# Patient Record
Sex: Female | Born: 1960 | Race: White | Hispanic: No | Marital: Married | Smoking: Never smoker
Health system: Southern US, Community
[De-identification: ages and names within clinical notes are randomized; demographics above are authoritative.]

## PROBLEM LIST (undated history)

## (undated) DIAGNOSIS — E669 Obesity, unspecified: Secondary | ICD-10-CM

## (undated) DIAGNOSIS — E7800 Pure hypercholesterolemia, unspecified: Secondary | ICD-10-CM

## (undated) DIAGNOSIS — N200 Calculus of kidney: Secondary | ICD-10-CM

## (undated) DIAGNOSIS — K5900 Constipation, unspecified: Secondary | ICD-10-CM

## (undated) DIAGNOSIS — G4730 Sleep apnea, unspecified: Secondary | ICD-10-CM

## (undated) DIAGNOSIS — M7989 Other specified soft tissue disorders: Secondary | ICD-10-CM

## (undated) DIAGNOSIS — J45991 Cough variant asthma: Secondary | ICD-10-CM

## (undated) DIAGNOSIS — E559 Vitamin D deficiency, unspecified: Secondary | ICD-10-CM

## (undated) HISTORY — DX: Calculus of kidney: N20.0

## (undated) HISTORY — DX: Vitamin D deficiency, unspecified: E55.9

## (undated) HISTORY — DX: Obesity, unspecified: E66.9

## (undated) HISTORY — PX: OTHER SURGICAL HISTORY: SHX169

## (undated) HISTORY — DX: Other specified soft tissue disorders: M79.89

## (undated) HISTORY — PX: NO PAST SURGERIES: SHX2092

## (undated) HISTORY — DX: Constipation, unspecified: K59.00

## (undated) HISTORY — DX: Cough variant asthma: J45.991

## (undated) HISTORY — DX: Sleep apnea, unspecified: G47.30

## (undated) HISTORY — DX: Pure hypercholesterolemia, unspecified: E78.00

---

## 2015-08-26 ENCOUNTER — Other Ambulatory Visit: Payer: Self-pay | Admitting: Unknown Physician Specialty

## 2015-08-26 ENCOUNTER — Ambulatory Visit
Admission: RE | Admit: 2015-08-26 | Discharge: 2015-08-26 | Disposition: A | Discharge status: Home / Self Care | Payer: BLUE CROSS/BLUE SHIELD | Source: Ambulatory Visit | Attending: Unknown Physician Specialty | Admitting: Unknown Physician Specialty

## 2015-08-26 DIAGNOSIS — R05 Cough: Secondary | ICD-10-CM | POA: Diagnosis not present

## 2015-08-26 DIAGNOSIS — R059 Cough, unspecified: Secondary | ICD-10-CM

## 2016-08-18 DIAGNOSIS — R053 Chronic cough: Secondary | ICD-10-CM | POA: Insufficient documentation

## 2016-08-18 DIAGNOSIS — R05 Cough: Secondary | ICD-10-CM | POA: Insufficient documentation

## 2016-09-23 ENCOUNTER — Ambulatory Visit
Admission: RE | Admit: 2016-09-23 | Discharge: 2016-09-23 | Disposition: A | Discharge status: Home / Self Care | Payer: BLUE CROSS/BLUE SHIELD | Source: Ambulatory Visit | Attending: Urology | Admitting: Urology

## 2016-09-23 ENCOUNTER — Encounter: Payer: Self-pay | Admitting: Urology

## 2016-09-23 ENCOUNTER — Ambulatory Visit (INDEPENDENT_AMBULATORY_CARE_PROVIDER_SITE_OTHER): Payer: BLUE CROSS/BLUE SHIELD | Admitting: Urology

## 2016-09-23 VITALS — BP 153/86 | HR 87 | Ht 65.0 in | Wt 252.0 lb

## 2016-09-23 DIAGNOSIS — R109 Unspecified abdominal pain: Secondary | ICD-10-CM

## 2016-09-23 DIAGNOSIS — R3129 Other microscopic hematuria: Secondary | ICD-10-CM | POA: Diagnosis not present

## 2016-09-23 DIAGNOSIS — K5900 Constipation, unspecified: Secondary | ICD-10-CM

## 2016-09-23 DIAGNOSIS — B009 Herpesviral infection, unspecified: Secondary | ICD-10-CM | POA: Insufficient documentation

## 2016-09-23 DIAGNOSIS — Z87442 Personal history of urinary calculi: Secondary | ICD-10-CM | POA: Diagnosis not present

## 2016-09-23 LAB — URINALYSIS, COMPLETE
BILIRUBIN UA: NEGATIVE
Glucose, UA: NEGATIVE
Ketones, UA: NEGATIVE
NITRITE UA: NEGATIVE
PH UA: 5.5 (ref 5.0–7.5)
PROTEIN UA: NEGATIVE
Specific Gravity, UA: <1.005 — ABNORMAL LOW (ref 1.005–1.030)
UUROB: 0.2 mg/dL (ref 0.2–1.0)

## 2016-09-23 LAB — MICROSCOPIC EXAMINATION: BACTERIA UA: NONE SEEN

## 2016-09-23 MED ORDER — TAMSULOSIN HCL 0.4 MG PO CAPS: 0.4000 mg | ORAL_CAPSULE | Freq: Every day | ORAL | 0 refills | Status: DC

## 2016-09-23 MED ORDER — HYDROCODONE-ACETAMINOPHEN 5-325 MG PO TABS: 1.0000 | ORAL_TABLET | Freq: Four times a day (QID) | ORAL | 0 refills | Status: DC | PRN

## 2016-09-23 NOTE — Progress Notes (Signed)
09/23/2016 3:18 PM   Monica Flowers Dec 06, 1960 [id number]  Referring provider: Gayland Curry, MD [address] [place], [place] [address]  Chief Complaint  Patient presents with  . Nephrolithiasis    New Patient    HPI: 56 yo F with right flank pain starting on Saturday who presents today for further evaluation.   The pain is decribed as constate but waxed and waints.   Her pain persists today.    No nausea or vomting. No urgency, frequency, or gross hematuria.     UA today shows 11-30 RBC/ HPF, otherwise negative.  She just started her period.    She does have a remote history of stones in 2004, passed stone spontaneously not requiring intervention.     She does pently of water but also drinks tea and Dr. Malachi Bonds.    KUB today shows no evidence of obvious ureteral calculi. She does have a large amount of colonic stool. She does have a personal history of issues with waxing and waning constipation and has had issues with bowel movements recently.   PMH: No past medical history on file.  Surgical History: Past Surgical History:  Procedure Laterality Date  . none      Home Medications:  Allergies as of 09/23/2016      Reactions   Amoxicillin-pot Clavulanate Rash      Medication List       Accurate as of 09/23/16 11:59 PM. Always use your most recent med list.          benzonatate 100 MG capsule Commonly known as:  TESSALON   doxylamine (Sleep) 25 MG tablet Commonly known as:  UNISOM .5 tab sunday to thrusday,1 tab friday and saturday   fluticasone furoate-vilanterol 100-25 MCG/INH Aepb Commonly known as:  BREO ELLIPTA Inhale into the lungs.   HYDROcodone-acetaminophen 5-325 MG tablet Commonly known as:  NORCO/VICODIN Take 1-2 tablets by mouth every 6 (six) hours as needed for moderate pain.   tamsulosin 0.4 MG Caps capsule Commonly known as:  FLOMAX Take 1 capsule (0.4 mg total) by mouth daily.       Allergies:  Allergies  Allergen  Reactions  . Amoxicillin-Pot Clavulanate Rash    Family History: Family History  Problem Relation Age of Onset  . Nephrolithiasis Maternal Grandmother   . Nephrolithiasis Maternal Grandfather   . Prostate cancer Neg Hx   . Kidney cancer Neg Hx     Social History:  reports that she has never smoked. She has never used smokeless tobacco. She reports that she drinks alcohol. She reports that she does not use drugs.  ROS: UROLOGY Frequent Urination?: No Hard to postpone urination?: No Burning/pain with urination?: No Get up at night to urinate?: No Leakage of urine?: Yes Urine stream starts and stops?: No Trouble starting stream?: No Do you have to strain to urinate?: No Blood in urine?: No Urinary tract infection?: No Sexually transmitted disease?: No Injury to kidneys or bladder?: No Painful intercourse?: No Weak stream?: No Currently pregnant?: No Vaginal bleeding?: No Last menstrual period?: 09/23/16  Gastrointestinal Nausea?: No Vomiting?: No Indigestion/heartburn?: No Diarrhea?: No Constipation?: No  Constitutional Fever: No Night sweats?: No Weight loss?: No Fatigue?: Yes  Skin Skin rash/lesions?: No Itching?: No  Eyes Blurred vision?: No Double vision?: No  Ears/Nose/Throat Sore throat?: No Sinus problems?: No  Hematologic/Lymphatic Swollen glands?: No Easy bruising?: No  Cardiovascular Leg swelling?: No Chest pain?: No  Respiratory Cough?: Yes Shortness of breath?: No  Endocrine Excessive thirst?: No  Musculoskeletal Back pain?: Yes Joint pain?: No  Neurological Headaches?: Yes Dizziness?: No  Psychologic Depression?: No Anxiety?: No  Physical Exam: BP (!) 153/86   Pulse 87   Ht 5\' 5"  (1.651 m)   Wt 252 lb (114.3 kg)   LMP 09/23/2016   BMI 41.93 kg/m   Constitutional:  Alert and oriented, No acute distress. HEENT: [place] AT, moist mucus membranes.  Trachea midline, no masses. Cardiovascular: No clubbing, cyanosis, or  edema. Respiratory: Normal respiratory effort, no increased work of breathing. GI: Abdomen is soft, nontender, nondistended, no abdominal masses GU: No CVA tenderness.  Skin: No rashes, bruises or suspicious lesions. Neurologic: Grossly intact, no focal deficits, moving all 4 extremities. Psychiatric: Normal mood and affect.  Laboratory Data: No recent labs for review  Urinalysis Results for orders placed or performed in visit on 09/23/16  Microscopic Examination  Result Value Ref Range   WBC, UA 0-5 0 - 5 /hpf   RBC, UA 11-30 (A) 0 - 2 /hpf   Epithelial Cells (non renal) 0-10 0 - 10 /hpf   Bacteria, UA None seen None seen/Few  Urinalysis, Complete  Result Value Ref Range   Specific Gravity, UA <1.005 (L) 1.005 - 1.030   pH, UA 5.5 5.0 - 7.5   Color, UA Yellow Yellow   Appearance Ur Hazy (A) Clear   Leukocytes, UA Trace (A) Negative   Protein, UA Negative Negative/Trace   Glucose, UA Negative Negative   Ketones, UA Negative Negative   RBC, UA 3+ (A) Negative   Bilirubin, UA Negative Negative   Urobilinogen, Ur 0.2 0.2 - 1.0 mg/dL   Nitrite, UA Negative Negative   Microscopic Examination See below:     Pertinent Imaging: CLINICAL DATA:  Left-sided abdominal pain since Saturday. History of kidney stones.  EXAM: ABDOMEN - 1 VIEW  COMPARISON:  Report of an abdominal and pelvic CT scan of July 07, 2003.  FINDINGS: The colonic stool burden is moderately increased. There is no small or large bowel obstructive pattern. No abnormal calcifications project over the renal fossae nor along the expected course of the ureters. There is a probable phlebolith on the right in the pelvis measuring 2 mm in diameter. The bony structures exhibit no acute abnormality.  IMPRESSION: No definite urinary tract stone. There is a probable phlebolith on the right in the pelvis.  Increased colonic stool burden may reflect constipation in the appropriate clinical  setting.   Electronically Signed   By: David  Martinique M.D.   On: 09/23/2016 11:23  KUB reviewed personally today and with the patient  Assessment & Plan:    1. Right flank pain Etiology unclear, no obvious stone seen on KUB, however, this does not necessarily mean that she is not having a stone episode  Options were reviewed today including pursuing medical expulsive therapy for presumed stone with Flomax, urinary strainer as if this were a stone episode versus proceeding with more definitive imaging in the form of a noncontrast CT scan  She is most interested in conservative management with Flomax any urinary strainer.  We will reassess in 2 weeks, if she continues to have flank pain of her flank pain worsens, we will proceed with CT scan.  Advised to bring in stone if passes fragment for analysis  Warning symptoms for urgent/emergent intervention were reviewed today including uncontrolled pain/ chills, fevers, or generalized feeling unwell.  - Abdomen 1 view (KUB) - Urinalysis, Complete  2. History of nephrolithiasis As above, no stones seen today  on KUB  3. Microscopic hematuria Etiology unclear, ? Stone episode vs. menstruation vs. other etiology  Will recheck UA next visit  4. Constipation, unspecified constipation type Significant stool burden identified on KUB today She does struggle with constipation and uses Miralax  Recommended considering more aggressive bowel cleanse, consideration of half bottle of mag citrate   Return in about 2 weeks (around 10/07/2016) for recheck left flank pain (any provider).  Hollice Espy, MD  [facility] [address] [place], [place] [address] [phone]

## 2016-10-08 ENCOUNTER — Encounter: Payer: Self-pay | Admitting: Urology

## 2016-10-08 ENCOUNTER — Ambulatory Visit (INDEPENDENT_AMBULATORY_CARE_PROVIDER_SITE_OTHER): Payer: BLUE CROSS/BLUE SHIELD | Admitting: Urology

## 2016-10-08 VITALS — BP 153/95 | HR 86 | Ht 64.5 in | Wt 252.2 lb

## 2016-10-08 DIAGNOSIS — R109 Unspecified abdominal pain: Secondary | ICD-10-CM

## 2016-10-08 LAB — URINALYSIS, COMPLETE
BILIRUBIN UA: NEGATIVE
Glucose, UA: NEGATIVE
Ketones, UA: NEGATIVE
LEUKOCYTES UA: NEGATIVE
Nitrite, UA: NEGATIVE
PH UA: 5.5 (ref 5.0–7.5)
PROTEIN UA: NEGATIVE
RBC, UA: NEGATIVE
Specific Gravity, UA: 1.02 (ref 1.005–1.030)
Urobilinogen, Ur: 0.2 mg/dL (ref 0.2–1.0)

## 2016-10-08 LAB — MICROSCOPIC EXAMINATION: BACTERIA UA: NONE SEEN

## 2016-10-08 NOTE — Progress Notes (Signed)
10/08/2016 4:21 PM   Monica Flowers 09/01/1960 [id number]  Referring provider: Gayland Curry, MD [address] [place], [place] [address]  Chief Complaint  Patient presents with  . Follow-up    2 weeks for flank pain    HPI: The patient is a 56 year old female with a distant stone history who presents today for 2 week follow-up of left flank pain of unclear etiology. Two weeks ago she had sudden onset of her right flank pain similar to previous bouts of obstructive uropathy.  She did have microscopic hematuria on her urinalysis that she was currently on her period. A KUB at that time did not show a stone that does not completely rule a stone out. She has a remote history of stones in 2000 for the past spontaneously never required intervention.  She was started at that time on Flomax and instructed to strain her urine.  Her flank pain spontaneous resolved. No current symptoms. No current nausea or vomiting. Hematuria has resolved on urinalysis today.   PMH: No past medical history on file.  Surgical History: Past Surgical History:  Procedure Laterality Date  . none      Home Medications:  Allergies as of 10/08/2016      Reactions   Amoxicillin-pot Clavulanate Rash      Medication List       Accurate as of 10/08/16  4:21 PM. Always use your most recent med list.          benzonatate 100 MG capsule Commonly known as:  TESSALON   doxylamine (Sleep) 25 MG tablet Commonly known as:  UNISOM .5 tab sunday to thrusday,1 tab friday and saturday   fluticasone furoate-vilanterol 100-25 MCG/INH Aepb Commonly known as:  BREO ELLIPTA Inhale into the lungs.   HYDROcodone-acetaminophen 5-325 MG tablet Commonly known as:  NORCO/VICODIN Take 1-2 tablets by mouth every 6 (six) hours as needed for moderate pain.   meloxicam 15 MG tablet Commonly known as:  MOBIC Take by mouth.   PSYLLIUM PO Take by mouth.   STOOL SOFTENER 100 MG capsule Generic drug:  docusate  sodium Take by mouth.   tamsulosin 0.4 MG Caps capsule Commonly known as:  FLOMAX Take 1 capsule (0.4 mg total) by mouth daily.   traZODone 50 MG tablet Commonly known as:  DESYREL Take 50 mg by mouth at bedtime.       Allergies:  Allergies  Allergen Reactions  . Amoxicillin-Pot Clavulanate Rash    Family History: Family History  Problem Relation Age of Onset  . Nephrolithiasis Maternal Grandmother   . Nephrolithiasis Maternal Grandfather   . Prostate cancer Neg Hx   . Kidney cancer Neg Hx   . Bladder Cancer Neg Hx     Social History:  reports that she has never smoked. She has never used smokeless tobacco. She reports that she drinks alcohol. She reports that she does not use drugs.  ROS: UROLOGY Frequent Urination?: No Hard to postpone urination?: No Burning/pain with urination?: No Get up at night to urinate?: No Leakage of urine?: Yes Urine stream starts and stops?: No Trouble starting stream?: No Do you have to strain to urinate?: No Blood in urine?: No Urinary tract infection?: No Sexually transmitted disease?: No Injury to kidneys or bladder?: No Painful intercourse?: No Weak stream?: No Currently pregnant?: No Vaginal bleeding?: No Last menstrual period?: n  Gastrointestinal Nausea?: No Vomiting?: No Indigestion/heartburn?: No Diarrhea?: No Constipation?: No  Constitutional Fever: No Night sweats?: No Weight loss?: No Fatigue?: No  Skin Skin rash/lesions?: No Itching?: No  Eyes Blurred vision?: No Double vision?: No  Ears/Nose/Throat Sore throat?: No Sinus problems?: No  Hematologic/Lymphatic Swollen glands?: No Easy bruising?: No  Cardiovascular Leg swelling?: No Chest pain?: No  Respiratory Cough?: Yes Shortness of breath?: No  Endocrine Excessive thirst?: No  Musculoskeletal Back pain?: No Joint pain?: No  Neurological Headaches?: No Dizziness?: No  Psychologic Depression?: No Anxiety?: No  Physical  Exam: BP (!) 153/95   Pulse 86   Ht 5' 4.5" (1.638 m)   Wt 252 lb 3.2 oz (114.4 kg)   LMP 09/23/2016   BMI 42.62 kg/m   Constitutional:  Alert and oriented, No acute distress. HEENT: [place] AT, moist mucus membranes.  Trachea midline, no masses. Cardiovascular: No clubbing, cyanosis, or edema. Respiratory: Normal respiratory effort, no increased work of breathing. GI: Abdomen is soft, nontender, nondistended, no abdominal masses GU: No CVA tenderness.  Skin: No rashes, bruises or suspicious lesions. Lymph: No cervical or inguinal adenopathy. Neurologic: Grossly intact, no focal deficits, moving all 4 extremities. Psychiatric: Normal mood and affect.  Laboratory Data: No results found for: WBC, HGB, HCT, MCV, PLT  No results found for: CREATININE  No results found for: PSA  No results found for: TESTOSTERONE  No results found for: HGBA1C  Urinalysis    Component Value Date/Time   APPEARANCEUR Hazy (A) 09/23/2016 1110   GLUCOSEU Negative 09/23/2016 1110   BILIRUBINUR Negative 09/23/2016 1110   PROTEINUR Negative 09/23/2016 1110   NITRITE Negative 09/23/2016 1110   LEUKOCYTESUR Trace (A) 09/23/2016 1110      Assessment & Plan:    1. Left flank pain -Resolved. No further workup needed. Follow-up as needed.  Return if symptoms worsen or fail to improve.  Nickie Retort, MD  [facility] [address], [place], [place] [address] [phone]

## 2017-01-09 IMAGING — CR DG CHEST 2V
1 series · 2 of 2 positions shown · non-contrast
Comparison: None.

CLINICAL DATA: Nonproductive cough for 2 months.

EXAM:
CHEST  2 VIEW

[Series 1: dg chest 2 view · 0.14mm/px · 2 of 2 slices shown]
[im 1/2]
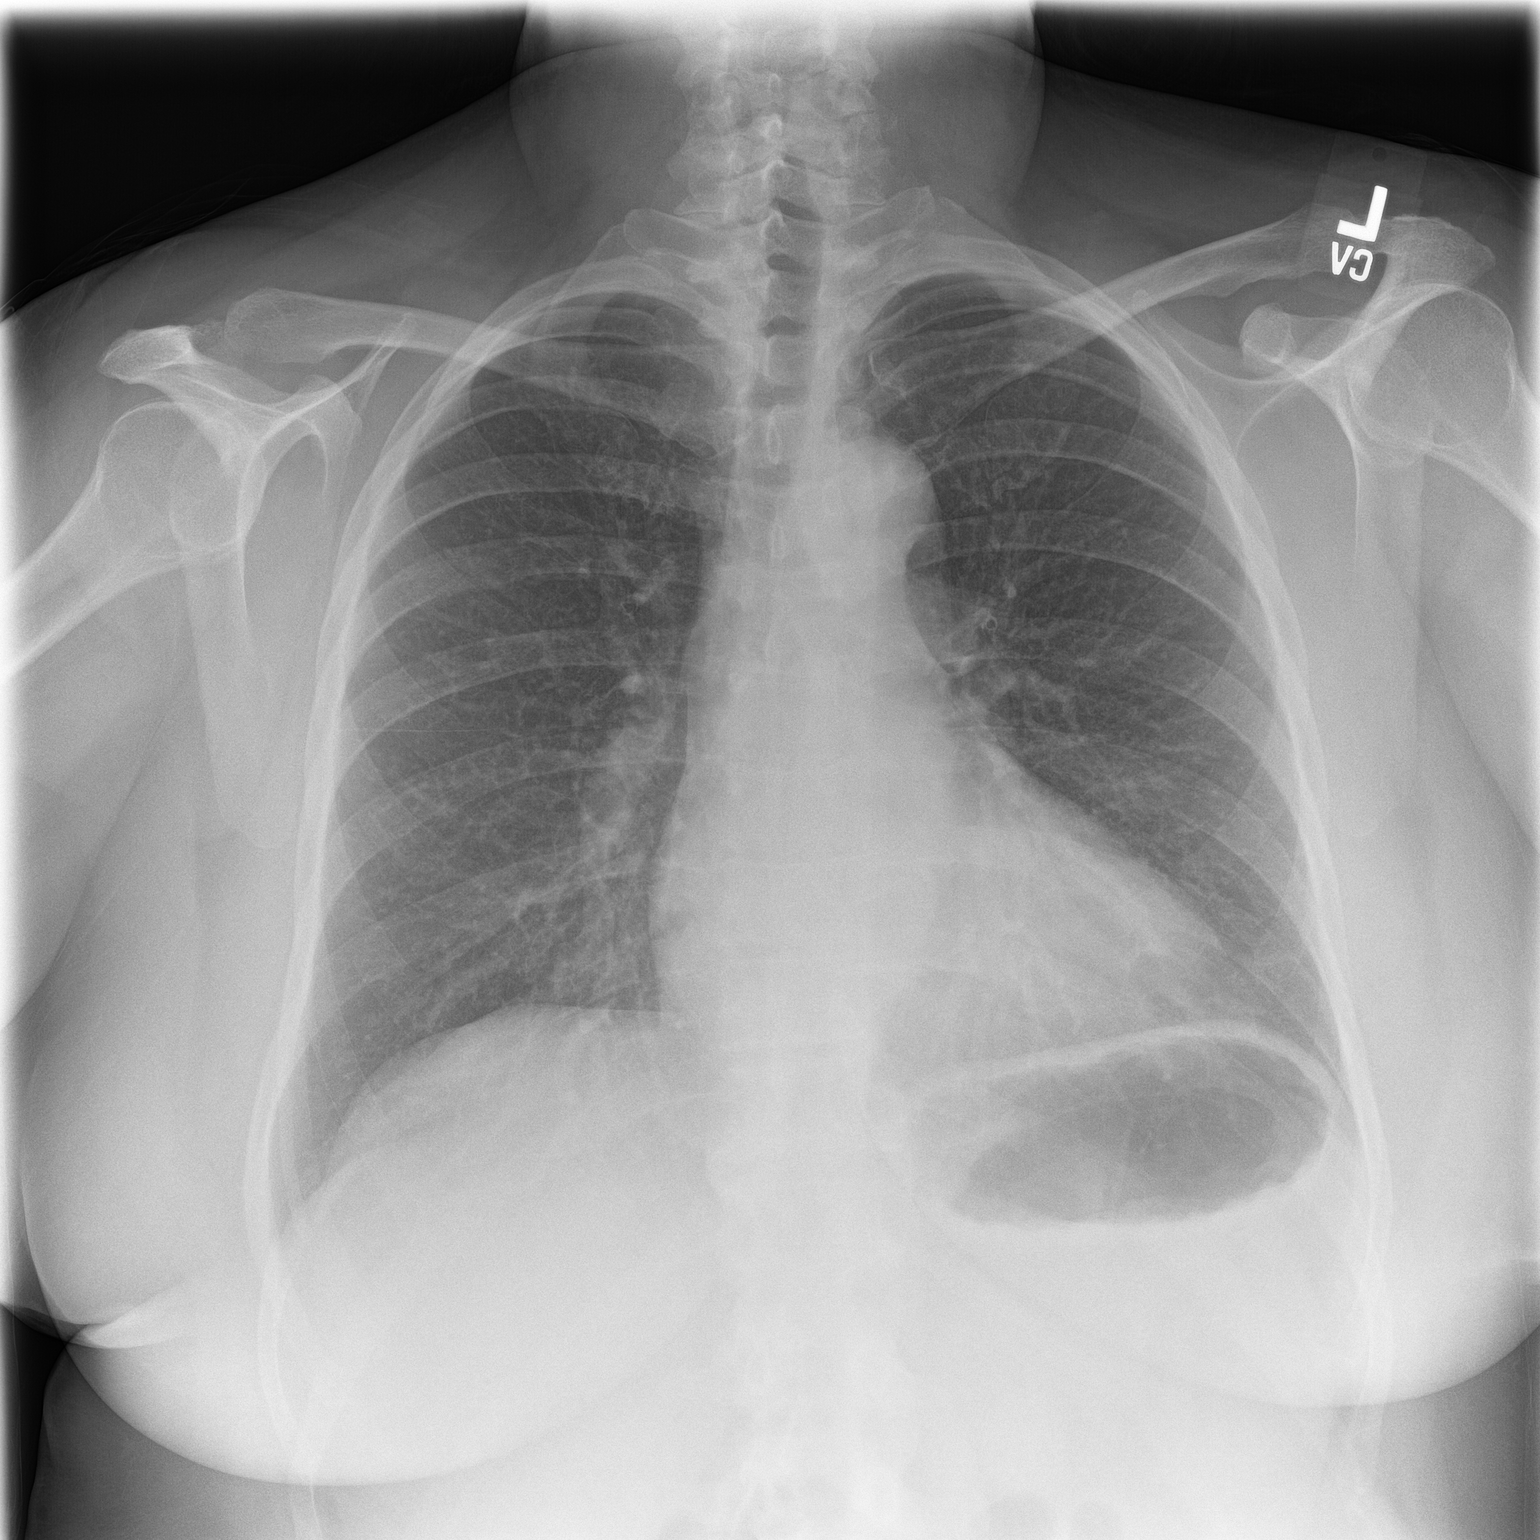
[im 2/2]
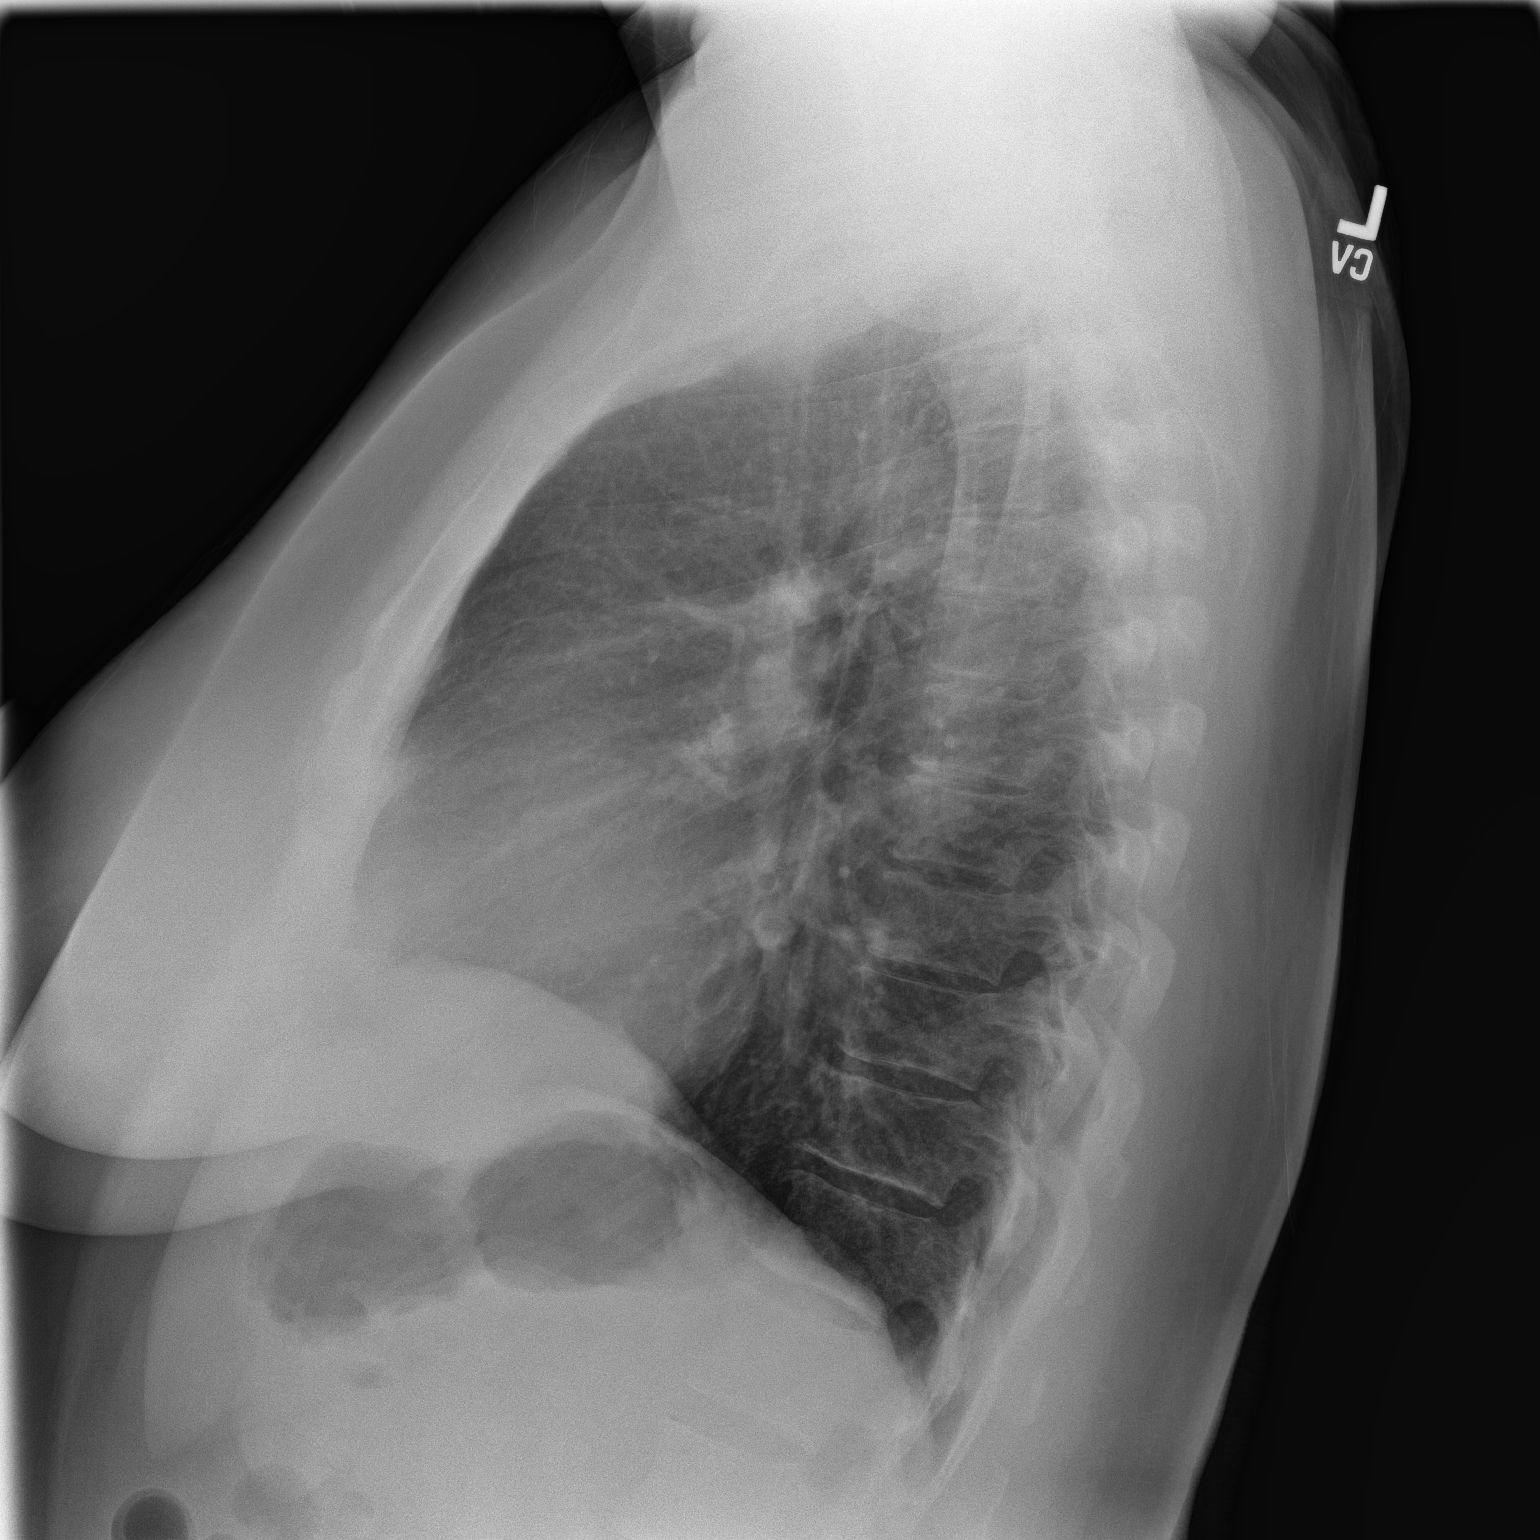

[2 of 2 positions shown; findings below may reference images not displayed]

FINDINGS: The heart size and mediastinal contours are within normal limits.
Both lungs are clear. The visualized skeletal structures are
unremarkable.
IMPRESSION: No active cardiopulmonary disease.

## 2018-02-07 IMAGING — CR DG ABDOMEN 1V
1 series · 2 of 2 positions shown · non-contrast
Comparison: Report of an abdominal and pelvic CT scan July 07, 2003.

CLINICAL DATA: Left-sided abdominal pain since [REDACTED]. History of
kidney stones.

EXAM:
ABDOMEN - 1 VIEW

[Series 1: dg abd 1 view · 0.14mm/px · 2 of 2 slices shown]
[im 1/2]
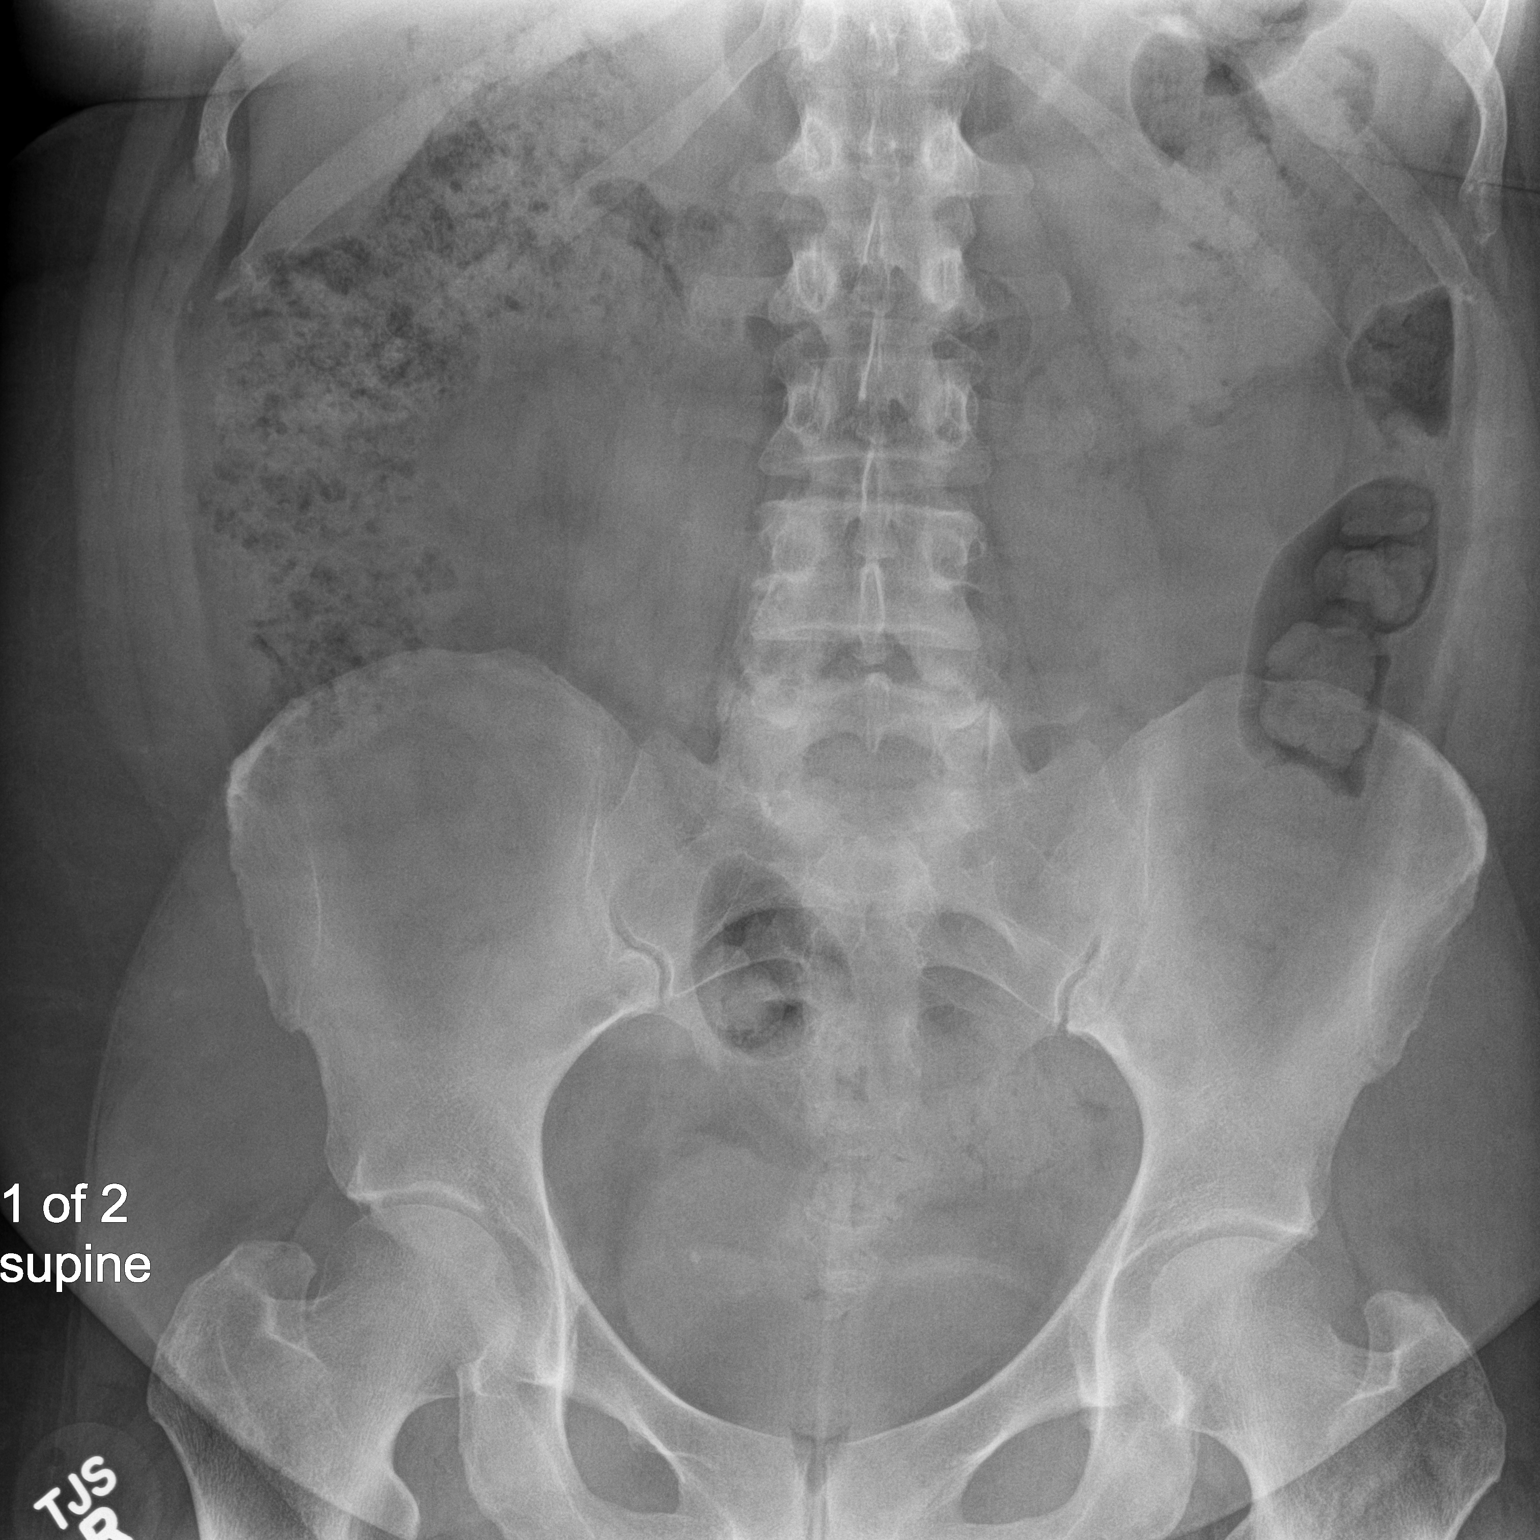
[im 2/2]
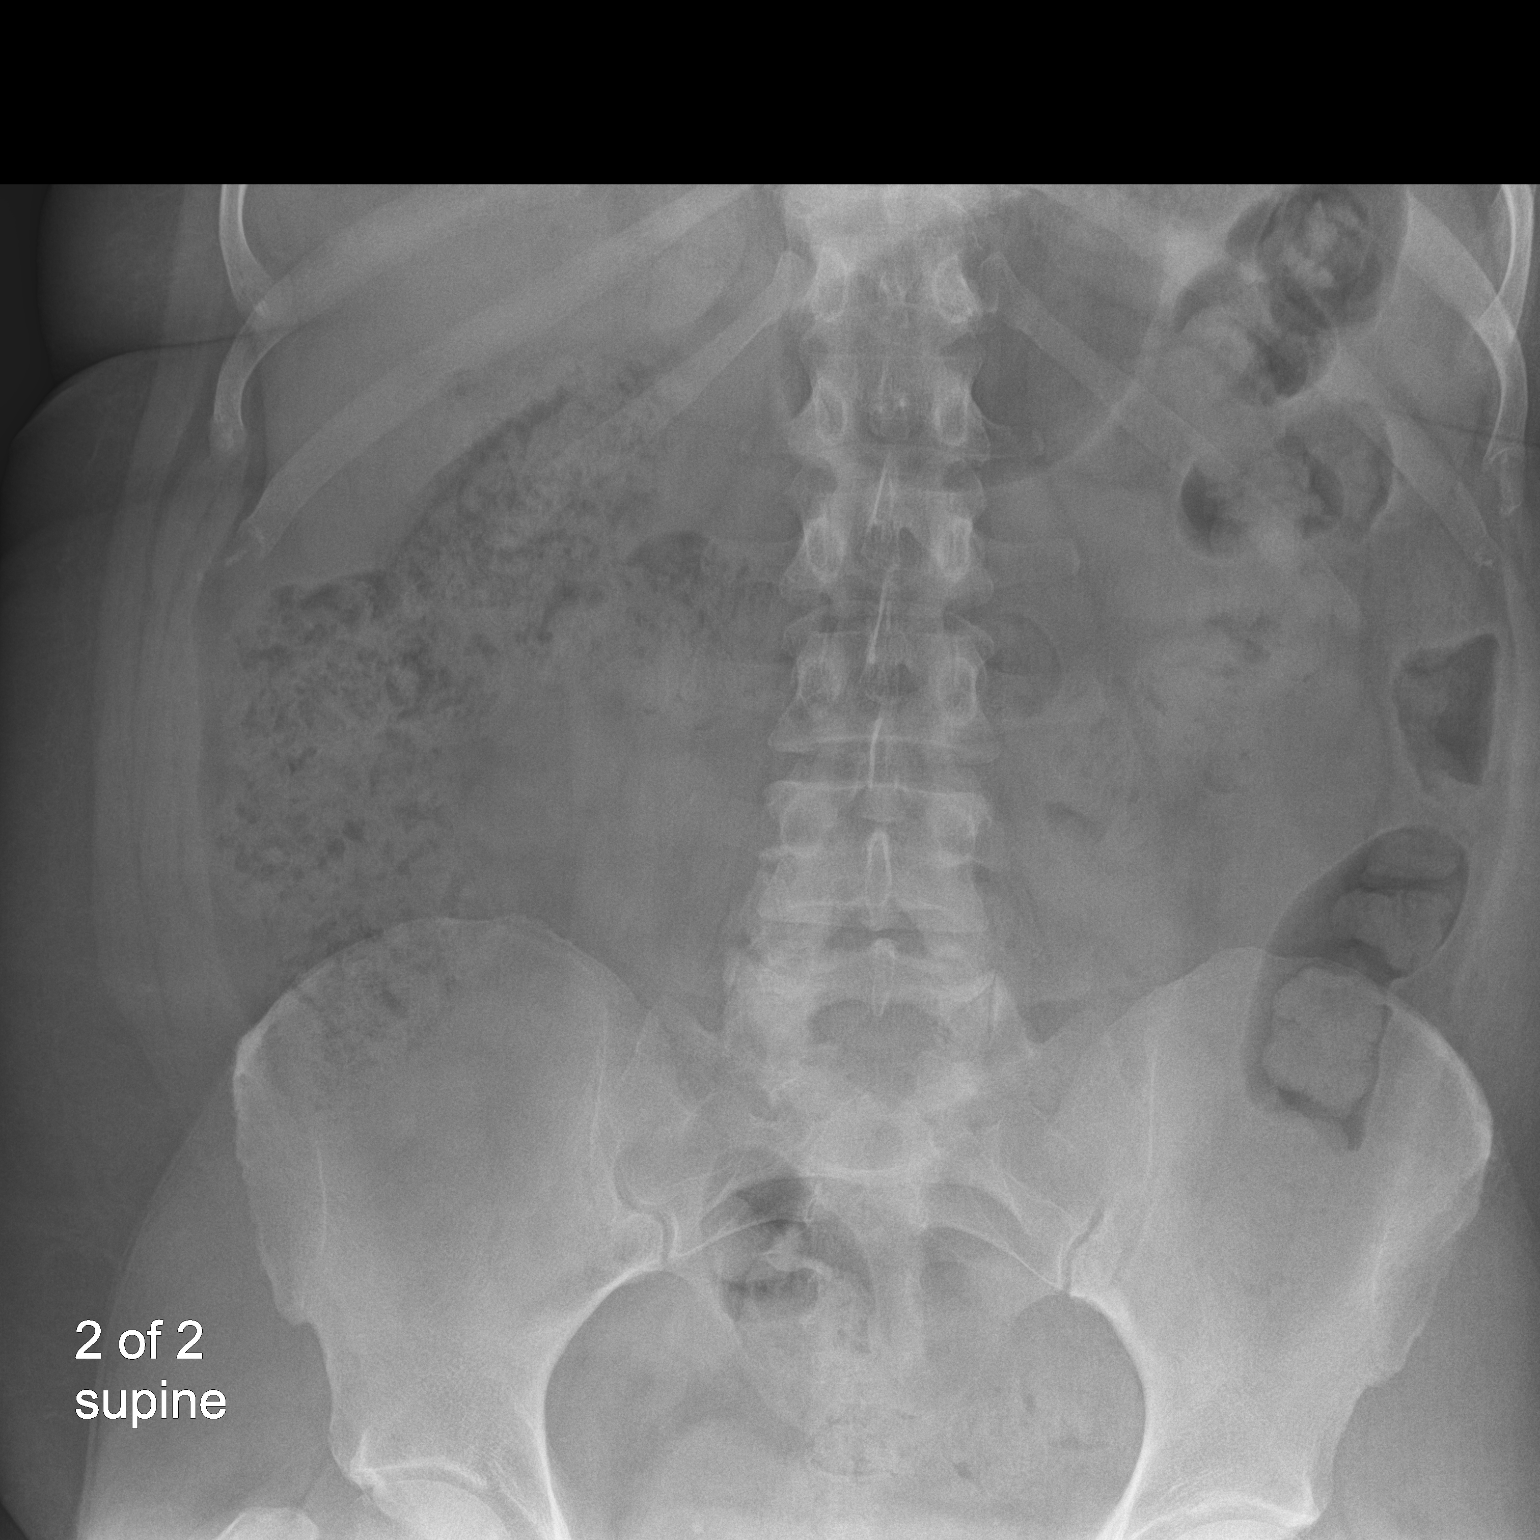

[2 of 2 positions shown; findings below may reference images not displayed]

FINDINGS: The colonic stool burden is moderately increased. There is no small
or large bowel obstructive pattern. No abnormal calcifications
project over the renal fossae nor along the expected course of the
ureters. There is a probable phlebolith on the right in the pelvis
measuring 2 mm in diameter. The bony structures exhibit no acute
abnormality.
IMPRESSION: No definite urinary tract stone. There is a probable phlebolith on
the right in the pelvis.

Increased colonic stool burden may reflect constipation in the
appropriate clinical setting.

## 2018-09-28 DIAGNOSIS — Z23 Encounter for immunization: Secondary | ICD-10-CM | POA: Diagnosis not present

## 2019-02-02 DIAGNOSIS — Z1231 Encounter for screening mammogram for malignant neoplasm of breast: Secondary | ICD-10-CM | POA: Diagnosis not present

## 2019-10-18 DIAGNOSIS — R0602 Shortness of breath: Secondary | ICD-10-CM | POA: Diagnosis not present

## 2019-10-24 DIAGNOSIS — R0602 Shortness of breath: Secondary | ICD-10-CM | POA: Diagnosis not present

## 2019-12-26 ENCOUNTER — Encounter: Payer: Self-pay | Admitting: Family Medicine

## 2019-12-26 ENCOUNTER — Ambulatory Visit (INDEPENDENT_AMBULATORY_CARE_PROVIDER_SITE_OTHER): Payer: BC Managed Care – PPO | Admitting: Family Medicine

## 2019-12-26 ENCOUNTER — Other Ambulatory Visit: Payer: Self-pay

## 2019-12-26 VITALS — BP 122/80 | HR 73 | Temp 98.0°F | Ht 64.5 in | Wt 271.4 lb

## 2019-12-26 DIAGNOSIS — Z1211 Encounter for screening for malignant neoplasm of colon: Secondary | ICD-10-CM

## 2019-12-26 DIAGNOSIS — Z7689 Persons encountering health services in other specified circumstances: Secondary | ICD-10-CM

## 2019-12-26 DIAGNOSIS — R5383 Other fatigue: Secondary | ICD-10-CM

## 2019-12-26 DIAGNOSIS — Z0183 Encounter for blood typing: Secondary | ICD-10-CM | POA: Diagnosis not present

## 2019-12-26 DIAGNOSIS — F5104 Psychophysiologic insomnia: Secondary | ICD-10-CM

## 2019-12-26 DIAGNOSIS — Z6841 Body Mass Index (BMI) 40.0 and over, adult: Secondary | ICD-10-CM

## 2019-12-26 DIAGNOSIS — E66813 Obesity, class 3: Secondary | ICD-10-CM

## 2019-12-26 LAB — CBC WITH DIFFERENTIAL/PLATELET
Basophils Absolute: 0.1 10*3/uL (ref 0.0–0.1)
Basophils Relative: 0.6 % (ref 0.0–3.0)
Eosinophils Absolute: 0.1 10*3/uL (ref 0.0–0.7)
Eosinophils Relative: 1.5 % (ref 0.0–5.0)
HCT: 42 % (ref 36.0–46.0)
Hemoglobin: 14.4 g/dL (ref 12.0–15.0)
Lymphocytes Relative: 23.9 % (ref 12.0–46.0)
Lymphs Abs: 2.2 10*3/uL (ref 0.7–4.0)
MCHC: 34.3 g/dL (ref 30.0–36.0)
MCV: 95.2 fl (ref 78.0–100.0)
Monocytes Absolute: 0.7 10*3/uL (ref 0.1–1.0)
Monocytes Relative: 8.1 % (ref 3.0–12.0)
Neutro Abs: 5.9 10*3/uL (ref 1.4–7.7)
Neutrophils Relative %: 65.9 % (ref 43.0–77.0)
Platelets: 265 10*3/uL (ref 150.0–400.0)
RBC: 4.41 Mil/uL (ref 3.87–5.11)
RDW: 13 % (ref 11.5–15.5)
WBC: 9 10*3/uL (ref 4.0–10.5)

## 2019-12-26 LAB — LIPID PANEL
Cholesterol: 171 mg/dL (ref 0–200)
HDL: 54.5 mg/dL (ref >39.00)
LDL Cholesterol: 94 mg/dL (ref 0–99)
NonHDL: 116.33
Total CHOL/HDL Ratio: 3
Triglycerides: 113 mg/dL (ref 0.0–149.0)
VLDL: 22.6 mg/dL (ref 0.0–40.0)

## 2019-12-26 LAB — COMPREHENSIVE METABOLIC PANEL
ALT: 22 U/L (ref 0–35)
AST: 18 U/L (ref 0–37)
Albumin: 3.9 g/dL (ref 3.5–5.2)
Alkaline Phosphatase: 85 U/L (ref 39–117)
BUN: 15 mg/dL (ref 6–23)
CO2: 27 mEq/L (ref 19–32)
Calcium: 8.8 mg/dL (ref 8.4–10.5)
Chloride: 103 mEq/L (ref 96–112)
Creatinine, Ser: 1.06 mg/dL (ref 0.40–1.20)
GFR: 53.46 mL/min — ABNORMAL LOW (ref >60.00)
Glucose, Bld: 90 mg/dL (ref 70–99)
Potassium: 4.2 mEq/L (ref 3.5–5.1)
Sodium: 136 mEq/L (ref 135–145)
Total Bilirubin: 0.5 mg/dL (ref 0.2–1.2)
Total Protein: 6.5 g/dL (ref 6.0–8.3)

## 2019-12-26 LAB — VITAMIN D 25 HYDROXY (VIT D DEFICIENCY, FRACTURES): VITD: 22.95 ng/mL — ABNORMAL LOW (ref 30.00–100.00)

## 2019-12-26 LAB — TSH: TSH: 1.74 u[IU]/mL (ref 0.35–4.50)

## 2019-12-26 LAB — HEMOGLOBIN A1C: Hgb A1c MFr Bld: 4.9 % (ref 4.6–6.5)

## 2019-12-26 MED ORDER — TRAZODONE HCL 50 MG PO TABS: 50.0000 mg | ORAL_TABLET | Freq: Every day | ORAL | 5 refills | Status: DC

## 2019-12-26 NOTE — Patient Instructions (Signed)
Good to see you today   A resource that I like is www.dietdoctor.com You tube- Dr. Luis Abed, Dr. Leeanne Rio, Dr. Gwenlyn Found  Here are some guidelines to help you with meal planning -  Avoid all processed and packaged foods (bread, pasta, crackers, chips, etc) and beverages containing calories.  Avoid added sugars and excessive natural sugars.  Attention to how you feel if you consume artificial sweeteners.  Do they make you more hungry or raise your blood sugar?  With every meal and snack, aim to get 20 g of protein (3 ounces of meat, 4 ounces of fish, 3 eggs, protein powder, 1 cup [place] yogurt, 1 cup cottage cheese, etc.)  Increase fiber in the form of non-starchy vegetables.  These help you feel full with very little carbohydrates and are good for gut health.  Eat 1 serving healthy carb per meal- 1/2 cup brown rice, beans, potato, corn- pay attention to whether or not this significantly raises your blood sugar. If it does, reduce the frequency you consume these.   Eat 2-3 servings of lower sugar fruits daily.  This includes berries, apples, oranges, peaches, pears, one half banana.  Have small amounts of good fats such as avocado, nuts, olive oil, nut butters, olives.  Add a little cheese to your salads to make them tasty.

## 2019-12-26 NOTE — Progress Notes (Signed)
Subjective:    Patient ID: Monica Flowers, female    DOB: 09-18-1960, 60 y.o.   MRN: [id number]  HPI Chief Complaint  Patient presents with  . New Patient (Initial Visit)    Previous PCP with [facility]. Trazodone refills.    This is a 59 yo female who presents today to establish care. 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 Works a lot of hours. Downsizing. Enjoys boating, going to [place], reading. Has two grown children, 3 grandsons. Has 2 dogs, 3 cats.    Last CPE- 07/28/2018 Mammo- 02/02/2019 Pap- 07/28/2018, negative, negative hpv Colonoscopy- never Tdap-01/17/2014 Flu- annual Covid- has had both vaccine Eye- Dec. 2020 Dental- overdue Exercise- not regular, doesn't enjoy ETOH- Oct. April- one bottle wine per month. May- September- wine cooler 4-5 over the weekend.  Sleep- takes trazodone 50-100 prn, mind is constantly thinking about work. Sometimes takes tylenol pm instead of trazodone.  Diet- Chick fil at in am ([employer] minis, hashbrowns, sweet tea), lunch- yogurt or leftovers lunch, eats out 3-4 nights ([place], [place]). Has done [employer] in past.  Job is very stressful and does not leave her much time to prepare meals.     Review of Systems Per HPI    Objective:   Physical Exam Vitals reviewed.  Constitutional:      General: She is not in acute distress.    Appearance: Normal appearance. She is obese. She is not ill-appearing, toxic-appearing or diaphoretic.  HENT:     Head: Normocephalic and atraumatic.  Eyes:     Conjunctiva/sclera: Conjunctivae normal.  Cardiovascular:     Rate and Rhythm: Normal rate.  Pulmonary:     Effort: Pulmonary effort is normal.  Neurological:     Mental Status: She is alert and oriented to person, place, and time.  Psychiatric:        Mood and Affect: Mood normal.        Behavior: Behavior normal.        Thought Content: Thought content normal.        Judgment: Judgment normal.     BP  122/80 (BP Location: Left Arm, Patient Position: Sitting, Cuff Size: Large)   Pulse 73   Temp 98 F (36.7 C) (Temporal)   Ht 5' 4.5" (1.638 m)   Wt 271 lb 6.4 oz (123.1 kg)   SpO2 99%   BMI 45.87 kg/m    Wt Readings from Last 3 Encounters:  12/26/19 271 lb 6.4 oz (123.1 kg)  10/08/16 252 lb 3.2 oz (114.4 kg)  09/23/16 252 lb (114.3 kg)   Depression screen PHQ 2/9 12/26/2019  Decreased Interest 0  Down, Depressed, Hopeless 0  PHQ - 2 Score 0       Assessment & Plan:  1. Encounter to establish care -Available EMR records, new patient paperwork, reviewed health maintenance with the patient and made recommendations  2. Class 3 severe obesity due to excess calories without serious comorbidity with body mass index (BMI) of 45.0 to 49.9 in adult [facility]) -Offered referral to dietitian, she will let me know if she wants to go this route.  Also discussed using [employer] or [employer].  Provided her with written information regarding healthy meal plans and additional resources. -Patient reports that she is lacking motivation and I am not sure how successful anything will be until she is able to harness [employer].  Discussed this with her. - Lipid Panel - Comprehensive metabolic panel - TSH - Vitamin  D, 25-hydroxy - Hemoglobin A1c  3. Fatigue, unspecified type -Likely due to combination of poor sleep, high stress, lack of exercise and obesity.  Will check labs. - Comprehensive metabolic panel - TSH - Vitamin D, 25-hydroxy - CBC with Differential - Hemoglobin A1c  4. Psychophysiological insomnia -She reports good result with as needed trazodone use.  Discussed importance of not using at same time as diphenhydramine-containing products. - traZODone (DESYREL) 50 MG tablet; Take 1-2 tablets (50-100 mg total) by mouth at bedtime.  Dispense: 60 tablet; Refill: 5  5. Encounter for blood typing -Patient was initially asked for her blood type to be obtained but changed her mind when she was  told she would have to out-of-pocket for this  6. Colon cancer screening - Ambulatory referral to Gastroenterology  Over 45 minutes were spent face-to-face with the patient during this encounter and >50% of that time was spent on counseling and coordination of care  -Follow-up in 6 months  This visit occurred during the SARS-CoV-2 public health emergency.  Safety protocols were in place, including screening questions prior to the visit, additional usage of staff PPE, and extensive cleaning of exam room while observing appropriate contact time as indicated for disinfecting solutions.    Clarene Reamer, FNP-BC  [facility]  12/26/2019 2:02 PM

## 2020-04-30 DIAGNOSIS — R0602 Shortness of breath: Secondary | ICD-10-CM | POA: Diagnosis not present

## 2020-05-07 DIAGNOSIS — G4733 Obstructive sleep apnea (adult) (pediatric): Secondary | ICD-10-CM | POA: Diagnosis not present

## 2020-05-07 DIAGNOSIS — Z23 Encounter for immunization: Secondary | ICD-10-CM | POA: Diagnosis not present

## 2020-05-07 DIAGNOSIS — R0602 Shortness of breath: Secondary | ICD-10-CM | POA: Diagnosis not present

## 2020-06-09 ENCOUNTER — Other Ambulatory Visit: Payer: Self-pay

## 2020-06-09 ENCOUNTER — Encounter: Payer: Self-pay | Admitting: Dermatology

## 2020-06-09 ENCOUNTER — Ambulatory Visit (INDEPENDENT_AMBULATORY_CARE_PROVIDER_SITE_OTHER): Payer: BC Managed Care – PPO | Admitting: Dermatology

## 2020-06-09 DIAGNOSIS — L719 Rosacea, unspecified: Secondary | ICD-10-CM

## 2020-06-09 DIAGNOSIS — L918 Other hypertrophic disorders of the skin: Secondary | ICD-10-CM | POA: Diagnosis not present

## 2020-06-09 DIAGNOSIS — B079 Viral wart, unspecified: Secondary | ICD-10-CM | POA: Diagnosis not present

## 2020-06-09 DIAGNOSIS — D229 Melanocytic nevi, unspecified: Secondary | ICD-10-CM

## 2020-06-09 DIAGNOSIS — L821 Other seborrheic keratosis: Secondary | ICD-10-CM | POA: Diagnosis not present

## 2020-06-09 DIAGNOSIS — D1800 Hemangioma unspecified site: Secondary | ICD-10-CM

## 2020-06-09 DIAGNOSIS — D485 Neoplasm of uncertain behavior of skin: Secondary | ICD-10-CM

## 2020-06-09 DIAGNOSIS — L814 Other melanin hyperpigmentation: Secondary | ICD-10-CM

## 2020-06-09 DIAGNOSIS — L578 Other skin changes due to chronic exposure to nonionizing radiation: Secondary | ICD-10-CM

## 2020-06-09 NOTE — Progress Notes (Signed)
New Patient Visit  Subjective  Monica Flowers is a 59 y.o. female who presents for the following: Annual Exam (No hx of skin cancer or dysplastic nevi ), lesion (on the nose - has been there for years and was removed by [facility] dermatology in the past but patient is unsure of those results), lesion (L areola - irritated, patient would like removed ), and warts (B/L foot - painful at times, patient would like to start treatment). The patient presents for Total-Body Skin Exam (TBSE) for skin cancer screening and mole check.  The following portions of the chart were reviewed this encounter and updated as appropriate:  Tobacco  Allergies  Meds  Problems  Med Hx  Surg Hx  Fam Hx     Review of Systems:  No other skin or systemic complaints except as noted in HPI or Assessment and Plan.  Objective  Well appearing patient in no apparent distress; mood and affect are within normal limits.  A full examination was performed including scalp, head, eyes, ears, nose, lips, neck, chest, axillae, abdomen, back, buttocks, bilateral upper extremities, bilateral lower extremities, hands, feet, fingers, toes, fingernails, and toenails. All findings within normal limits unless otherwise noted below.  Objective  Face: Redness on the cheeks.   Images        Objective  L little toe x 1, L lat foot x 4, R lat foot x 1 (6): Verrucous papules -- Discussed viral etiology and contagion.   Objective  Nose: 1.5 x 1.0 cm brown macule        Objective  L areola at 7 o'clock: 0.6 cm flesh colored papule   Assessment & Plan  Rosacea - chronic progressive.  Currently flared with some papules, but pt wants to continue moisturizer which typically controls condition. Face Discussed BBL laser tx for the erythema. Patient declines treatment for papules at this time. She states papules are controlled with OTC lotion that she uses on her face.  Viral warts, unspecified type (6) L little toe x 1, L  lat foot x 4, R lat foot x 1 Start [facility] 5FU/Sal acid paste QHS.   Destruction of lesion - L little toe x 1, L lat foot x 4, R lat foot x 1 Complexity: simple   Destruction method: cryotherapy   Informed consent: discussed and consent obtained   Timeout:  patient name, date of birth, surgical site, and procedure verified Lesion destroyed using liquid nitrogen: Yes   Region frozen until ice ball extended beyond lesion: Yes   Outcome: patient tolerated procedure well with no complications   Post-procedure details: wound care instructions given    Neoplasm of uncertain behavior of skin (2) Nose  "biopsy proven "benign Lentigo" at [facility] in 2018, but has grown and changed.  [facility] records reviewed.  See photo.  Skin / nail biopsy Type of biopsy: tangential   Informed consent: discussed and consent obtained   Timeout: patient name, date of birth, surgical site, and procedure verified   Procedure prep:  Patient was prepped and draped in usual sterile fashion Prep type:  Isopropyl alcohol Anesthesia: the lesion was anesthetized in a standard fashion   Anesthetic:  1% lidocaine w/ epinephrine 1-100,000 buffered w/ 8.4% NaHCO3 Instrument used: flexible razor blade   Hemostasis achieved with: pressure, aluminum chloride and electrodesiccation   Outcome: patient tolerated procedure well   Post-procedure details: sterile dressing applied and wound care instructions given   Dressing type: bandage and petrolatum    Specimen  1 - Surgical pathology Differential Diagnosis: D48.5 lentigo vs lentigo maligna  Check Margins: No 1.5 x 1.0 cm brown macule  L areola at 7 o'clock  Epidermal / dermal shaving  Lesion diameter (cm):  0.6 Informed consent: discussed and consent obtained   Timeout: patient name, date of birth, surgical site, and procedure verified   Procedure prep:  Patient was prepped and draped in usual sterile fashion Prep type:  Isopropyl alcohol Anesthesia: the lesion was  anesthetized in a standard fashion   Anesthetic:  1% lidocaine w/ epinephrine 1-100,000 buffered w/ 8.4% NaHCO3 Instrument used: flexible razor blade   Hemostasis achieved with: pressure, aluminum chloride and electrodesiccation   Outcome: patient tolerated procedure well   Post-procedure details: sterile dressing applied and wound care instructions given   Dressing type: bandage and petrolatum    Specimen 2 - Surgical pathology Differential Diagnosis: D48.5 irritated nevus vs skin tag r/o dysplasia Check Margins: No 0.6 cm flesh colored papule  Nose was previously biopsied by [facility] dermatology - pathology results showed: Dermatopathology Order (05/04/2017 8:54 AM EDT) Dermatopathology Order (05/04/2017 8:54 AM EDT)  Component Value Ref Range Performed At Pathologist Signature  Case Report Surgical Pathology Report                         Case: [id number]                                Authorizing Provider:  Jamelle Rushing, MD       Collected:           05/04/2017 0854             Ordering Location:     [facility] [place] Received:            05/04/2017 1435             Pathologist:           Jamelle Rushing, MD                                                        Specimen:    Skin, Nose, shave                                                                       [facility]MATOP[facility]    Final Diagnosis Nose, shave - Lentigo.    [facility] Electronically signed by Jamelle Rushing, MD on 05/05/2017 at 2:35 PM  Clinical History 0.8 x 0.9 cm tan macule with small area of darker brown pigmentation in the periphery; Lentigo vs SK vs LM/Melanoma   [facility]    Gross Description The specimen was received labeled with the patient's name and measured 9 x 8 x 2 mm, serially sectioned, NTR.   [facility]    Microscopic Description Light microscopic examination is performed by Dr. Jamelle Rushing.   [facility]    EMBEDDED IMAGES  [facility]      Lentigines - Scattered tan macules - Discussed due to sun exposure - Benign, observe - Call for any changes  Seborrheic Keratoses - Stuck-on, waxy, tan-brown papules and plaques  - Discussed benign etiology and prognosis. - Observe - Call for any changes  Melanocytic Nevi - Tan-brown and/or pink-flesh-colored symmetric macules and papules - Benign appearing on exam today - Observation - Call clinic for new or changing moles - Recommend daily use of broad spectrum spf 30+ sunscreen to sun-exposed areas.   Hemangiomas - Red papules - Discussed benign nature - Observe - Call for any changes  Actinic Damage - diffuse scaly erythematous macules with underlying dyspigmentation - Recommend daily broad spectrum sunscreen SPF 30+ to sun-exposed areas, reapply every 2 hours as needed.  - Call for new or changing lesions.  Skin cancer screening performed today.  Return in about 2 months (around 08/09/2020) for wart follow up, biopsy follow up .  Luther Redo, CMA, am acting as scribe for Sarina Ser, MD .  Documentation: I have reviewed the above documentation for accuracy and completeness, and I agree with the above.  Sarina Ser, MD

## 2020-06-09 NOTE — Patient Instructions (Signed)

## 2020-06-17 ENCOUNTER — Telehealth: Payer: Self-pay

## 2020-06-17 NOTE — Telephone Encounter (Signed)
Patient advised of biopsy results. Advised patient to discuss in further detail with Dr. Nehemiah Massed at January follow up.

## 2020-06-17 NOTE — Telephone Encounter (Signed)
-----   Message from Ralene Bathe, MD sent at 06/13/2020  7:43 PM EDT ----- Diagnosis 1. Skin , nose LENTIGO 2. Skin , left areola at 7 o'clock [place], INFLAMED  1- benign "freckle" Can be treated with Ln2 freezing or Laser treatment We do all this at [place] Pt may schedule with Dr Raliegh Ip if desired or discuss at next appt (08/28/20) 2- benign "skin tag"

## 2020-06-27 ENCOUNTER — Ambulatory Visit: Payer: BC Managed Care – PPO | Admitting: Family Medicine

## 2020-06-30 ENCOUNTER — Ambulatory Visit: Payer: BC Managed Care – PPO | Admitting: Family Medicine

## 2020-07-05 DIAGNOSIS — G4733 Obstructive sleep apnea (adult) (pediatric): Secondary | ICD-10-CM | POA: Diagnosis not present

## 2020-07-06 DIAGNOSIS — G4733 Obstructive sleep apnea (adult) (pediatric): Secondary | ICD-10-CM | POA: Diagnosis not present

## 2020-07-09 ENCOUNTER — Other Ambulatory Visit: Payer: Self-pay

## 2020-07-09 ENCOUNTER — Ambulatory Visit (INDEPENDENT_AMBULATORY_CARE_PROVIDER_SITE_OTHER): Payer: BC Managed Care – PPO | Admitting: Family Medicine

## 2020-07-09 ENCOUNTER — Encounter: Payer: Self-pay | Admitting: Family Medicine

## 2020-07-09 VITALS — BP 118/60 | HR 61 | Temp 97.7°F | Ht 64.5 in | Wt 259.1 lb

## 2020-07-09 DIAGNOSIS — R0602 Shortness of breath: Secondary | ICD-10-CM | POA: Diagnosis not present

## 2020-07-09 DIAGNOSIS — Z6841 Body Mass Index (BMI) 40.0 and over, adult: Secondary | ICD-10-CM

## 2020-07-09 DIAGNOSIS — F5104 Psychophysiologic insomnia: Secondary | ICD-10-CM | POA: Diagnosis not present

## 2020-07-09 DIAGNOSIS — E66813 Obesity, class 3: Secondary | ICD-10-CM | POA: Insufficient documentation

## 2020-07-09 MED ORDER — TRAZODONE HCL 50 MG PO TABS: 50.0000 mg | ORAL_TABLET | Freq: Every day | ORAL | 5 refills | Status: DC

## 2020-07-09 NOTE — Patient Instructions (Signed)
Take vitamin D3, 2,000 IU daily and have rechecked in 6 months

## 2020-07-09 NOTE — Progress Notes (Signed)
   Subjective:    Patient ID: Monica Flowers, female    DOB: 02/06/1961, 59 y.o.   MRN: [id number]  HPI Chief Complaint  Patient presents with  . Follow-up    x5mos   Obesity- has been doing [employer], VR work out daily for last 2 months.    Insomnia- had sleep study recently and awaiting results. Fatigue. Uses trazodone most nights for sleep with some improvement.   Stress- high stress. Does not expect it to change as long as she is in her current position.   Vitamin D deficiency- just recently started vitamin D replacement.   SOB- followed by pulmonary, no change.    Review of Systems    Denies chest pain, abdominal pain, diarrhea/ constipation, has some leg swelling with prolonged sitting, resolves overnight.  Objective:   Physical Exam Physical Exam  Constitutional: Oriented to person, place, and time. Appears well-developed and well-nourished. Obese.  HENT:  Head: Normocephalic and atraumatic.  Eyes: Conjunctivae are normal.  Neck: Normal range of motion. Neck supple.  Cardiovascular: Normal rate, regular rhythm and normal heart sounds.   Pulmonary/Chest: Effort normal and breath sounds normal.  Musculoskeletal: No lower extremity edema.   Neurological: Alert and oriented to person, place, and time.  Skin: Skin is warm and dry.  Psychiatric: Normal mood and affect. Behavior is normal. Judgment and thought content normal.  Vitals reviewed.     BP 118/60 (BP Location: Right Arm, Patient Position: Sitting, Cuff Size: Large)   Pulse 61   Temp 97.7 F (36.5 C) (Oral)   Ht 5' 4.5" (1.638 m)   Wt 259 lb 1.9 oz (117.5 kg)   SpO2 98%   BMI 43.79 kg/m  Wt Readings from Last 3 Encounters:  07/09/20 259 lb 1.9 oz (117.5 kg)  12/26/19 271 lb 6.4 oz (123.1 kg)  10/08/16 252 lb 3.2 oz (114.4 kg)       Assessment & Plan:  1. Psychophysiological insomnia - some improvement with trazodone, continue prn - traZODone (DESYREL) 50 MG tablet; Take 1-2 tablets (50-100  mg total) by mouth at bedtime.  Dispense: 60 tablet; Refill: 5  2. Class 3 severe obesity due to excess calories without serious comorbidity with body mass index (BMI) of 40.0 to 44.9 in adult [facility]) - some weight loss, encouraged her efforts, exercise, reduce sugars/ starches  3. SOB (shortness of breath) on exertion - followed by pulmonary, no change  This visit occurred during the SARS-CoV-2 public health emergency.  Safety protocols were in place, including screening questions prior to the visit, additional usage of staff PPE, and extensive cleaning of exam room while observing appropriate contact time as indicated for disinfecting solutions.      Clarene Reamer, FNP-BC  [facility]  07/09/2020 1:40 PM

## 2020-07-31 DIAGNOSIS — G4733 Obstructive sleep apnea (adult) (pediatric): Secondary | ICD-10-CM | POA: Diagnosis not present

## 2020-07-31 DIAGNOSIS — J302 Other seasonal allergic rhinitis: Secondary | ICD-10-CM | POA: Diagnosis not present

## 2020-07-31 DIAGNOSIS — Z6841 Body Mass Index (BMI) 40.0 and over, adult: Secondary | ICD-10-CM | POA: Diagnosis not present

## 2020-08-28 ENCOUNTER — Other Ambulatory Visit: Payer: Self-pay

## 2020-08-28 ENCOUNTER — Ambulatory Visit (INDEPENDENT_AMBULATORY_CARE_PROVIDER_SITE_OTHER): Payer: BC Managed Care – PPO | Admitting: Dermatology

## 2020-08-28 DIAGNOSIS — L814 Other melanin hyperpigmentation: Secondary | ICD-10-CM | POA: Diagnosis not present

## 2020-08-28 DIAGNOSIS — B070 Plantar wart: Secondary | ICD-10-CM

## 2020-08-28 NOTE — Patient Instructions (Signed)
Instructions for [facility] Medications  One or more of your medications was sent to the [facility] mail order compounding pharmacy. You will receive an email from them and can purchase the medicine through that link. It will then be mailed to your home at the address you confirmed. If for any reason you do not receive an email from them, please check your spam folder. If you still do not find the email, please let us know. [facility] phone number is [phone].   

## 2020-08-28 NOTE — Progress Notes (Signed)
   Follow-Up Visit   Subjective  Monica Flowers is a 60 y.o. female who presents for the following: Lentigo bx proven (Nose, bx 06/09/20 discuss txt options, pt said was excised in past and came back larger, she also said it gets darker in summer) and Warts (R lat foot x 1, L little toe x 1, L lat foot x 4, used 5FU/sal acid paste ~1.5 wks, pt feels they are clear).  The following portions of the chart were reviewed this encounter and updated as appropriate:   Tobacco  Allergies  Meds  Problems  Med Hx  Surg Hx  Fam Hx     Review of Systems:  No other skin or systemic complaints except as noted in HPI or Assessment and Plan.  Objective  Well appearing patient in no apparent distress; mood and affect are within normal limits.  A focused examination was performed including face, feet. Relevant physical exam findings are noted in the Assessment and Plan.  Objective  Nose: Brown macule  Objective  Left lat foot, Right lat foot: Verrucous paps   Assessment & Plan  Lentigo Nose Bx proven 06/09/20 Per pt excised at another office  in past and then came back larger  Discussed BBL/ Laser vs LN2,  discussed potentially best results from BBL, will plan BBL in summer when darker Discussed insurance does not cover BBL; the fee will be $200 per txt session.  May take 3 or more sessions for best results.  Can also consider topical skin lightener / Hydroquinone  Plantar wart (2) Left lat foot; Right lat foot Improved Restart 5FU/Sal acid past  qhs to aa feet  Return in about 5 months (around 01/26/2021) for BBL to nose 1 spot, recheck Warts.  I, Othelia Pulling, RMA, am acting as scribe for Sarina Ser, MD .  Documentation: I have reviewed the above documentation for accuracy and completeness, and I agree with the above.  Sarina Ser, MD

## 2020-09-02 ENCOUNTER — Encounter: Payer: Self-pay | Admitting: Dermatology

## 2020-12-09 ENCOUNTER — Telehealth: Payer: Self-pay

## 2020-12-09 NOTE — Telephone Encounter (Signed)
Left patient a message advising per our insurance department that the BBL treatment for the Lentigo on the nose is not covered by insurance and the cost would be $200 per treatment session for that one spot.  Advised pt to call office if any questions./sh

## 2021-01-07 ENCOUNTER — Other Ambulatory Visit: Payer: Self-pay | Admitting: *Deleted

## 2021-01-07 DIAGNOSIS — F5104 Psychophysiologic insomnia: Secondary | ICD-10-CM

## 2021-01-07 NOTE — Telephone Encounter (Signed)
Refill request Trazodone Last refill 07/09/20 #60/5 Last office visit 07/09/20 Upcoming appointment with Dr. Einar Pheasant 01/21/21

## 2021-01-08 ENCOUNTER — Encounter: Payer: BC Managed Care – PPO | Admitting: Family Medicine

## 2021-01-08 MED ORDER — TRAZODONE HCL 50 MG PO TABS: 50.0000 mg | ORAL_TABLET | Freq: Every day | ORAL | 5 refills | Status: DC

## 2021-01-15 ENCOUNTER — Encounter: Payer: BC Managed Care – PPO | Admitting: Family Medicine

## 2021-01-21 ENCOUNTER — Encounter: Payer: Self-pay | Admitting: Family Medicine

## 2021-01-21 ENCOUNTER — Ambulatory Visit (INDEPENDENT_AMBULATORY_CARE_PROVIDER_SITE_OTHER): Payer: BC Managed Care – PPO | Admitting: Family Medicine

## 2021-01-21 ENCOUNTER — Other Ambulatory Visit: Payer: Self-pay

## 2021-01-21 VITALS — BP 140/90 | HR 70 | Temp 97.5°F | Ht 65.0 in | Wt 245.0 lb

## 2021-01-21 DIAGNOSIS — F5104 Psychophysiologic insomnia: Secondary | ICD-10-CM

## 2021-01-21 DIAGNOSIS — Z6841 Body Mass Index (BMI) 40.0 and over, adult: Secondary | ICD-10-CM

## 2021-01-21 DIAGNOSIS — Z1211 Encounter for screening for malignant neoplasm of colon: Secondary | ICD-10-CM

## 2021-01-21 DIAGNOSIS — G4733 Obstructive sleep apnea (adult) (pediatric): Secondary | ICD-10-CM | POA: Diagnosis not present

## 2021-01-21 DIAGNOSIS — J45991 Cough variant asthma: Secondary | ICD-10-CM | POA: Insufficient documentation

## 2021-01-21 DIAGNOSIS — L309 Dermatitis, unspecified: Secondary | ICD-10-CM | POA: Insufficient documentation

## 2021-01-21 LAB — LIPID PANEL
Cholesterol: 163 mg/dL (ref 0–200)
HDL: 43.9 mg/dL (ref >39.00)
LDL Cholesterol: 97 mg/dL (ref 0–99)
NonHDL: 118.91
Total CHOL/HDL Ratio: 4
Triglycerides: 110 mg/dL (ref 0.0–149.0)
VLDL: 22 mg/dL (ref 0.0–40.0)

## 2021-01-21 LAB — COMPREHENSIVE METABOLIC PANEL
ALT: 30 U/L (ref 0–35)
AST: 21 U/L (ref 0–37)
Albumin: 4 g/dL (ref 3.5–5.2)
Alkaline Phosphatase: 82 U/L (ref 39–117)
BUN: 13 mg/dL (ref 6–23)
CO2: 27 mEq/L (ref 19–32)
Calcium: 9.3 mg/dL (ref 8.4–10.5)
Chloride: 102 mEq/L (ref 96–112)
Creatinine, Ser: 0.96 mg/dL (ref 0.40–1.20)
GFR: 65.49 mL/min (ref >60.00)
Glucose, Bld: 83 mg/dL (ref 70–99)
Potassium: 4.1 mEq/L (ref 3.5–5.1)
Sodium: 137 mEq/L (ref 135–145)
Total Bilirubin: 0.8 mg/dL (ref 0.2–1.2)
Total Protein: 7 g/dL (ref 6.0–8.3)

## 2021-01-21 MED ORDER — TRIAMCINOLONE ACETONIDE 0.1 % EX CREA: 1.0000 "application " | TOPICAL_CREAM | Freq: Two times a day (BID) | CUTANEOUS | 0 refills | Status: DC

## 2021-01-21 MED ORDER — PREDNISONE 10 MG PO TABS: ORAL_TABLET | ORAL | 0 refills | Status: AC

## 2021-01-21 NOTE — Patient Instructions (Signed)
Ear rash - use steroid cream as needed - if no improvement with 1 week of treatment then schedule follow-up   Steroids for the cough

## 2021-01-21 NOTE — Assessment & Plan Note (Signed)
Controlled. Will alternate Trazodone 50-100 mg and tylenol PM. Will occasionally take 2 trazodone depending on the day. Cont trazodone

## 2021-01-21 NOTE — Assessment & Plan Note (Signed)
Weight down ~15 lbs. Continue trying to make healthy food choices and regular exercise.

## 2021-01-21 NOTE — Assessment & Plan Note (Signed)
Skin with mild breakdown around piercing and behind ear. Suspect eczema vs scalp psoriasis though less severe currently. Advised trial of topical steroids and return if not improving.

## 2021-01-21 NOTE — Assessment & Plan Note (Signed)
Still waiting for CPAP to arrive. Following with neurology for this.

## 2021-01-21 NOTE — Assessment & Plan Note (Signed)
Pt notes prior episodes improved with steroids. Will do steroids. Reassuring exam w/o wheezing today.

## 2021-01-21 NOTE — Progress Notes (Signed)
Subjective:     Monica Flowers is a 60 y.o. female presenting for Cough and Transitions Of Care     Cough   #Cough - for several years - has sleep apnea - Following with Dr. Idamae Schuller - advised to contact if she gets a cold or cough - Started getting a cough May 1 - had a cold - cough has persisted - no wheezing - treatment: nothing - allergy pill daily - Pulm suggested - cough variant asthma - in the past with cough will do prednisone - if caught early   #ear seeping - skin behind the ear - more issues in the summer - not happening now  #Weight  - has been doing supernatural - VR cardio work-out - has lost 15 lbs - trying to eat at more home but hard with the heat - the DOE has improved some with the exercise  Review of Systems  Respiratory:  Positive for cough.    07/09/2020: Clinic - Insomnia - trazodone, obesity - lifestyle, SOB - pulm 07/2020: Neurology - severe OSA  - APAP  Social History   Tobacco Use  Smoking Status Never  Smokeless Tobacco Never        Objective:    BP Readings from Last 3 Encounters:  01/21/21 140/90  07/09/20 118/60  12/26/19 122/80   Wt Readings from Last 3 Encounters:  01/21/21 245 lb (111.1 kg)  07/09/20 259 lb 1.9 oz (117.5 kg)  12/26/19 271 lb 6.4 oz (123.1 kg)    BP 140/90   Pulse 70   Temp (!) 97.5 F (36.4 C) (Temporal)   Ht 5\' 5"  (1.651 m)   Wt 245 lb (111.1 kg)   SpO2 98%   BMI 40.77 kg/m    Physical Exam Constitutional:      General: She is not in acute distress.    Appearance: She is well-developed. She is not diaphoretic.  HENT:     Right Ear: External ear normal.     Left Ear: External ear normal.  Eyes:     Conjunctiva/sclera: Conjunctivae normal.  Cardiovascular:     Rate and Rhythm: Normal rate and regular rhythm.     Heart sounds: No murmur heard. Pulmonary:     Effort: Pulmonary effort is normal. No respiratory distress.     Breath sounds: Normal breath sounds. No wheezing or  rhonchi.  Musculoskeletal:     Cervical back: Neck supple.  Skin:    General: Skin is warm and dry.     Capillary Refill: Capillary refill takes less than 2 seconds.  Neurological:     Mental Status: She is alert. Mental status is at baseline.  Psychiatric:        Mood and Affect: Mood normal.        Behavior: Behavior normal.          Assessment & Plan:   Problem List Items Addressed This Visit       Respiratory   OSA (obstructive sleep apnea)    Still waiting for CPAP to arrive. Following with neurology for this.        Cough variant asthma    Pt notes prior episodes improved with steroids. Will do steroids. Reassuring exam w/o wheezing today.        Relevant Medications   predniSONE (DELTASONE) 10 MG tablet     Musculoskeletal and Integument   Dermatitis    Skin with mild breakdown around piercing and behind ear. Suspect eczema vs scalp psoriasis though  less severe currently. Advised trial of topical steroids and return if not improving.        Relevant Medications   triamcinolone cream (KENALOG) 0.1 %     Other   Psychophysiological insomnia - Primary    Controlled. Will alternate Trazodone 50-100 mg and tylenol PM. Will occasionally take 2 trazodone depending on the day. Cont trazodone       Class 3 severe obesity due to excess calories without serious comorbidity with body mass index (BMI) of 40.0 to 44.9 in adult (HCC)    Weight down ~15 lbs. Continue trying to make healthy food choices and regular exercise.        Relevant Orders   Comprehensive metabolic panel   Lipid panel   Other Visit Diagnoses     Colon cancer screening       Relevant Orders   Ambulatory referral to gastroenterology for colonoscopy        Return in about 1 year (around 01/21/2022).  Lesleigh Noe, MD  This visit occurred during the SARS-CoV-2 public health emergency.  Safety protocols were in place, including screening questions prior to the visit, additional  usage of staff PPE, and extensive cleaning of exam room while observing appropriate contact time as indicated for disinfecting solutions.

## 2021-02-24 ENCOUNTER — Other Ambulatory Visit: Payer: Self-pay

## 2021-02-24 ENCOUNTER — Ambulatory Visit (INDEPENDENT_AMBULATORY_CARE_PROVIDER_SITE_OTHER): Payer: BC Managed Care – PPO | Admitting: Dermatology

## 2021-02-24 DIAGNOSIS — B079 Viral wart, unspecified: Secondary | ICD-10-CM | POA: Diagnosis not present

## 2021-02-24 DIAGNOSIS — L814 Other melanin hyperpigmentation: Secondary | ICD-10-CM | POA: Diagnosis not present

## 2021-02-24 NOTE — Progress Notes (Signed)
   Follow-Up Visit   Subjective  Monica Flowers is a 60 y.o. female who presents for the following: bx proven lentigo (Of the nose - patient is here today for BBL laser treatment) and warts (Of the B/L feet - patient can't see areas to treat them with [facility] wart paste so she is waiting for her mother to move to town so she will have someone to help her.).  The following portions of the chart were reviewed this encounter and updated as appropriate:   Tobacco  Allergies  Meds  Problems  Med Hx  Surg Hx  Fam Hx     Review of Systems:  No other skin or systemic complaints except as noted in HPI or Assessment and Plan.  Objective  Well appearing patient in no apparent distress; mood and affect are within normal limits.  A focused examination was performed including the face and feet. Relevant physical exam findings are noted in the Assessment and Plan.  R lat foot x 1, L lat foot x 2 Verrucous papules -- Discussed viral etiology and contagion.   Nose Brown macule.            Assessment & Plan  Viral warts, unspecified type R lat foot x 1, L lat foot x 2  Discussed viral etiology and risk of spread.  Discussed multiple treatments may be required to clear warts.  Discussed possible post-treatment dyspigmentation and risk of recurrence.  May continue [facility] wart paste QHS in a few weeks to the R lat foot (more obvious lesion).  Destruction of lesion - R lat foot x 1, L lat foot x 2 Complexity: simple   Destruction method: cryotherapy   Informed consent: discussed and consent obtained   Timeout:  patient name, date of birth, surgical site, and procedure verified Lesion destroyed using liquid nitrogen: Yes   Region frozen until ice ball extended beyond lesion: Yes   Outcome: patient tolerated procedure well with no complications   Post-procedure details: wound care instructions given    Lentigo -biopsy-proven, benign Nose  Discussed BBL laser and  consent form signed. Advised that laser may lighten the site but it could get darker could require more than 1 treatment to get biopsy results.  There is a charge for each treatment.   [place] BBL - 02/24/21 1700      Patient Details   Current Skin Care: None    Skin Type: I    Anesthestic Cream Applied: No    Photo Takes: Yes    Consent Signed: Yes    Improvement from Previous Treatment: No   N/A - 1st tx today     Treatment Details   Date: 02/24/21    Treatment #: 1    Area: nose    Filter: 3rd Pass      3rd Pass   Location: Other    Device: --   nose   BBL j/cm2: 15    PW Msec Sec: 15    Cooling Temp: 15    Pulses: 13     Return for follow up and BBL in 2-3 mths.  Luther Redo, CMA, am acting as scribe for Sarina Ser, MD . Documentation: I have reviewed the above documentation for accuracy and completeness, and I agree with the above.  Sarina Ser, MD

## 2021-02-24 NOTE — Patient Instructions (Signed)

## 2021-02-26 ENCOUNTER — Encounter: Payer: Self-pay | Admitting: Dermatology

## 2021-02-26 DIAGNOSIS — G4733 Obstructive sleep apnea (adult) (pediatric): Secondary | ICD-10-CM | POA: Diagnosis not present

## 2021-03-26 DIAGNOSIS — H0288B Meibomian gland dysfunction left eye, upper and lower eyelids: Secondary | ICD-10-CM | POA: Diagnosis not present

## 2021-03-26 DIAGNOSIS — H0288A Meibomian gland dysfunction right eye, upper and lower eyelids: Secondary | ICD-10-CM | POA: Diagnosis not present

## 2021-03-26 DIAGNOSIS — H5203 Hypermetropia, bilateral: Secondary | ICD-10-CM | POA: Diagnosis not present

## 2021-03-29 DIAGNOSIS — G4733 Obstructive sleep apnea (adult) (pediatric): Secondary | ICD-10-CM | POA: Diagnosis not present

## 2021-04-29 DIAGNOSIS — G4733 Obstructive sleep apnea (adult) (pediatric): Secondary | ICD-10-CM | POA: Diagnosis not present

## 2021-05-19 ENCOUNTER — Ambulatory Visit: Payer: BC Managed Care – PPO | Admitting: Dermatology

## 2021-05-29 DIAGNOSIS — G4733 Obstructive sleep apnea (adult) (pediatric): Secondary | ICD-10-CM | POA: Diagnosis not present

## 2021-06-29 DIAGNOSIS — G4733 Obstructive sleep apnea (adult) (pediatric): Secondary | ICD-10-CM | POA: Diagnosis not present

## 2021-07-29 DIAGNOSIS — G4733 Obstructive sleep apnea (adult) (pediatric): Secondary | ICD-10-CM | POA: Diagnosis not present

## 2021-07-31 ENCOUNTER — Other Ambulatory Visit: Payer: Self-pay | Admitting: Family

## 2021-07-31 DIAGNOSIS — F5104 Psychophysiologic insomnia: Secondary | ICD-10-CM

## 2021-08-29 DIAGNOSIS — G4733 Obstructive sleep apnea (adult) (pediatric): Secondary | ICD-10-CM | POA: Diagnosis not present

## 2021-09-01 ENCOUNTER — Encounter: Payer: Self-pay | Admitting: Family Medicine

## 2021-09-28 DIAGNOSIS — M79672 Pain in left foot: Secondary | ICD-10-CM | POA: Diagnosis not present

## 2021-09-28 DIAGNOSIS — D2371 Other benign neoplasm of skin of right lower limb, including hip: Secondary | ICD-10-CM | POA: Diagnosis not present

## 2021-09-28 DIAGNOSIS — M7752 Other enthesopathy of left foot: Secondary | ICD-10-CM | POA: Diagnosis not present

## 2021-09-28 DIAGNOSIS — M79671 Pain in right foot: Secondary | ICD-10-CM | POA: Diagnosis not present

## 2021-09-28 DIAGNOSIS — M778 Other enthesopathies, not elsewhere classified: Secondary | ICD-10-CM | POA: Diagnosis not present

## 2021-09-28 DIAGNOSIS — Q828 Other specified congenital malformations of skin: Secondary | ICD-10-CM | POA: Diagnosis not present

## 2021-09-29 DIAGNOSIS — G4733 Obstructive sleep apnea (adult) (pediatric): Secondary | ICD-10-CM | POA: Diagnosis not present

## 2021-10-19 DIAGNOSIS — M79672 Pain in left foot: Secondary | ICD-10-CM | POA: Diagnosis not present

## 2021-10-19 DIAGNOSIS — M7752 Other enthesopathy of left foot: Secondary | ICD-10-CM | POA: Diagnosis not present

## 2021-10-19 DIAGNOSIS — M79671 Pain in right foot: Secondary | ICD-10-CM | POA: Diagnosis not present

## 2021-10-19 DIAGNOSIS — Q828 Other specified congenital malformations of skin: Secondary | ICD-10-CM | POA: Diagnosis not present

## 2021-10-27 DIAGNOSIS — G4733 Obstructive sleep apnea (adult) (pediatric): Secondary | ICD-10-CM | POA: Diagnosis not present

## 2021-10-30 ENCOUNTER — Other Ambulatory Visit: Payer: Self-pay | Admitting: Primary Care

## 2021-10-30 DIAGNOSIS — F5104 Psychophysiologic insomnia: Secondary | ICD-10-CM

## 2021-11-27 DIAGNOSIS — G4733 Obstructive sleep apnea (adult) (pediatric): Secondary | ICD-10-CM | POA: Diagnosis not present

## 2022-01-26 ENCOUNTER — Encounter: Payer: Self-pay | Admitting: Family Medicine

## 2022-01-26 ENCOUNTER — Ambulatory Visit (INDEPENDENT_AMBULATORY_CARE_PROVIDER_SITE_OTHER): Payer: BC Managed Care – PPO | Admitting: Family Medicine

## 2022-01-26 VITALS — BP 120/80 | HR 59 | Temp 97.5°F | Ht 64.25 in | Wt 251.2 lb

## 2022-01-26 DIAGNOSIS — M25473 Effusion, unspecified ankle: Secondary | ICD-10-CM

## 2022-01-26 DIAGNOSIS — F5104 Psychophysiologic insomnia: Secondary | ICD-10-CM | POA: Diagnosis not present

## 2022-01-26 DIAGNOSIS — Z1211 Encounter for screening for malignant neoplasm of colon: Secondary | ICD-10-CM | POA: Diagnosis not present

## 2022-01-26 DIAGNOSIS — Z6841 Body Mass Index (BMI) 40.0 and over, adult: Secondary | ICD-10-CM | POA: Diagnosis not present

## 2022-01-26 DIAGNOSIS — Z0000 Encounter for general adult medical examination without abnormal findings: Secondary | ICD-10-CM

## 2022-01-26 DIAGNOSIS — Z1231 Encounter for screening mammogram for malignant neoplasm of breast: Secondary | ICD-10-CM

## 2022-01-26 LAB — COMPREHENSIVE METABOLIC PANEL
ALT: 17 U/L (ref 0–35)
AST: 15 U/L (ref 0–37)
Albumin: 3.8 g/dL (ref 3.5–5.2)
Alkaline Phosphatase: 85 U/L (ref 39–117)
BUN: 15 mg/dL (ref 6–23)
CO2: 29 mEq/L (ref 19–32)
Calcium: 9.1 mg/dL (ref 8.4–10.5)
Chloride: 102 mEq/L (ref 96–112)
Creatinine, Ser: 0.96 mg/dL (ref 0.40–1.20)
GFR: 65.02 mL/min (ref >60.00)
Glucose, Bld: 83 mg/dL (ref 70–99)
Potassium: 4.3 mEq/L (ref 3.5–5.1)
Sodium: 138 mEq/L (ref 135–145)
Total Bilirubin: 0.6 mg/dL (ref 0.2–1.2)
Total Protein: 6.4 g/dL (ref 6.0–8.3)

## 2022-01-26 LAB — CBC
HCT: 42.5 % (ref 36.0–46.0)
Hemoglobin: 14.4 g/dL (ref 12.0–15.0)
MCHC: 34 g/dL (ref 30.0–36.0)
MCV: 93.7 fl (ref 78.0–100.0)
Platelets: 293 10*3/uL (ref 150.0–400.0)
RBC: 4.53 Mil/uL (ref 3.87–5.11)
RDW: 13.1 % (ref 11.5–15.5)
WBC: 7.1 10*3/uL (ref 4.0–10.5)

## 2022-01-26 LAB — TSH: TSH: 1.65 u[IU]/mL (ref 0.35–5.50)

## 2022-01-26 LAB — LIPID PANEL
Cholesterol: 174 mg/dL (ref 0–200)
HDL: 50.5 mg/dL (ref >39.00)
LDL Cholesterol: 87 mg/dL (ref 0–99)
NonHDL: 123.67
Total CHOL/HDL Ratio: 3
Triglycerides: 183 mg/dL — ABNORMAL HIGH (ref 0.0–149.0)
VLDL: 36.6 mg/dL (ref 0.0–40.0)

## 2022-01-26 MED ORDER — TRAZODONE HCL 50 MG PO TABS: 50.0000 mg | ORAL_TABLET | Freq: Every day | ORAL | 1 refills | Status: DC

## 2022-01-26 NOTE — Patient Instructions (Addendum)
Ankle swelling - labs today - elevate legs - compression socks - reduce salt - increase daytime activity   Work on healthy diet and regular exercise  Go to the dentist  Call [facility] to schedule mammogram  #Referral I have placed a referral to a specialist for you. You should receive a phone call from the specialty office. Make sure your voicemail is not full and that if you are able to answer your phone to unknown or new numbers.   It may take up to 2 weeks to hear about the referral. If you do not hear anything in 2 weeks, please call our office and ask to speak with the referral coordinator.  - GI

## 2022-01-26 NOTE — Assessment & Plan Note (Signed)
Encouraged exercise, low salt, increased hydration, and compression socks.  Labs today to assess for possible causes.

## 2022-01-26 NOTE — Progress Notes (Signed)
Annual Exam   Chief Complaint:  Chief Complaint  Patient presents with   Annual Exam   Foot Swelling    History of Present Illness:  [name] is a 61 y.o. No obstetric history on file. who LMP was No LMP recorded., presents today for her annual examination.    #Bilateral ankle swelling - worse at the end of the day - water - 3-4 bottles of water a day - salt - does add salt to foods - has not tried elevation or compression socks (apart from flying)   Nutrition She does get adequate calcium and Vitamin D in her diet. Diet: adds salt, could be better Exercise: not currently     Social History   Tobacco Use  Smoking Status Never  Smokeless Tobacco Never   Social History   Substance and Sexual Activity  Alcohol Use Yes   Comment: 1-2 drinks during the summer on the weekend   Social History   Substance and Sexual Activity  Drug Use No     General Health Dentist in the last year: No Eye doctor: yes  Safety The patient wears seatbelts: yes.     The patient feels safe at home and in their relationships: yes.   Menstrual:  Symptoms of menopause: feels hot at night One year without a period  GYN She is not sexually active.    Cervical Cancer Screening (21-65):   Last Pap:   December 2019 Results were: no abnormalities /neg HPV DNA   Breast Cancer Screening (Age 91-74):  There is no FH of breast cancer. There is no FH of ovarian cancer. BRCA screening Not Indicated.  Last Mammogram: 01/2019 The patient does want a mammogram this year.    Colon Cancer Screening:  Age 45-75 yo - benefits outweigh the risk. Adults 47-85 yo who have never been screened benefit.  Benefits: 134000 people in 2016 will be diagnosed and 49,000 will die - early detection helps Harms: Complications 2/2 to colonoscopy High Risk (Colonoscopy): genetic disorder (Lynch syndrome or familial adenomatous polyposis), personal hx of IBD, previous adenomatous polyp, or previous  colorectal cancer, FamHx start 10 years before the age at diagnosis, increased in males and black race  Options:  FIT - looks for hemoglobin (blood in the stool) - specific and fairly sensitive - must be done annually Cologuard - looks for DNA and blood - more sensitive - therefore can have more false positives, every 3 years Colonoscopy - every 10 years if normal - sedation, bowl prep, must have someone drive you  Shared decision making and the patient had decided to do colonoscopy.   Social History   Tobacco Use  Smoking Status Never  Smokeless Tobacco Never    Lung Cancer Screening (Ages 82-42): not applicable   Weight Wt Readings from Last 3 Encounters:  01/26/22 251 lb 4 oz (114 kg)  01/21/21 245 lb (111.1 kg)  07/09/20 259 lb 1.9 oz (117.5 kg)   Patient has high BMI  BMI Readings from Last 1 Encounters:  01/26/22 42.79 kg/m     Chronic disease screening Blood pressure monitoring:  BP Readings from Last 3 Encounters:  01/26/22 120/80  01/21/21 140/90  07/09/20 118/60    Lipid Monitoring: Indication for screening: age >15, obesity, diabetes, family hx, CV risk factors.  Lipid screening: Yes  Lab Results  Component Value Date   CHOL 163 01/21/2021   HDL 43.90 01/21/2021   LDLCALC 97 01/21/2021   TRIG 110.0 01/21/2021   CHOLHDL  4 01/21/2021     Diabetes Screening: age >16, overweight, family hx, PCOS, hx of gestational diabetes, at risk ethnicity Diabetes Screening screening: Yes  Lab Results  Component Value Date   HGBA1C 4.9 12/26/2019     Past Medical History:  Diagnosis Date   Cough variant asthma     Past Surgical History:  Procedure Laterality Date   none      Prior to Admission medications   Medication Sig Start Date End Date Taking? Authorizing Provider  diphenhydramine-acetaminophen (TYLENOL PM) 25-500 MG TABS tablet Take 1 tablet by mouth at bedtime as needed (uses in alternation with Trazodone).    Yes [provider]   docusate sodium (COLACE) 100 MG capsule Take by mouth.  02/24/10  Yes [provider]  Fexofenadine HCl (ALLERGY 24-HR PO) Take 2 tablets by mouth at bedtime. Allertec ([facility])    Yes [provider]  traZODone (DESYREL) 50 MG tablet TAKE 1 TO 2 TABLETS BY MOUTH AT BEDTIME 10/30/21  Yes Lesleigh Noe, MD  triamcinolone cream (KENALOG) 0.1 % Apply 1 application topically 2 (two) times daily. 01/21/21  Yes Lesleigh Noe, MD    Allergies  Allergen Reactions   Amoxicillin-Pot Clavulanate Rash    Gynecologic History: No LMP recorded.  Obstetric History: No obstetric history on file.  Social History   Socioeconomic History   Marital status: Married    Spouse name: Jeneen Rinks   Number of children: 2   Years of education: Associates degree   Highest education level: Not on file  Occupational History   Not on file  Tobacco Use   Smoking status: Never   Smokeless tobacco: Never  Vaping Use   Vaping Use: Never used  Substance and Sexual Activity   Alcohol use: Yes    Comment: 1-2 drinks during the summer on the weekend   Drug use: No   Sexual activity: Not Currently  Other Topics Concern   Not on file  Social History Narrative   01/21/21   From: the area   Living: with Jeneen Rinks (fred), Husband (1982)   Work: life Herbalist, estate planning      Family: Educational psychologist and Water quality scientist, 3 grandchildren - local       [place]      Exercise: VR Cardio   Diet: more home cooked, trying to eat healthy - limiting eating out to 2 times a week      Safety   Seat belts: Yes    Guns: Yes  and secure   Safe in relationships: Yes       Social Determinants of Radio broadcast assistant Strain: Not on file  Food Insecurity: Not on file  Transportation Needs: Not on file  Physical Activity: Not on file  Stress: Not on file  Social Connections: Not on file  Intimate Partner Violence: Not on file    Family History  Problem Relation Age of Onset    Hyperlipidemia Mother    Breast cancer Mother 25   Bradycardia Mother        has a pacemaker   Idiopathic pulmonary fibrosis Father    Hypertension Father    Heart attack Father    Stroke Daughter    Nephrolithiasis Maternal Grandmother    Breast cancer Maternal Grandmother 80   Nephrolithiasis Maternal Grandfather    Hypertension Maternal Grandfather    Idiopathic pulmonary fibrosis Paternal Grandmother    Prostate cancer Neg Hx    Kidney cancer Neg  Hx    Bladder Cancer Neg Hx     Review of Systems  Constitutional:  Negative for chills and fever.  HENT:  Negative for congestion and sore throat.   Eyes:  Negative for blurred vision and double vision.  Respiratory:  Negative for shortness of breath.   Cardiovascular:  Positive for leg swelling. Negative for chest pain.  Gastrointestinal:  Negative for heartburn, nausea and vomiting.  Genitourinary: Negative.   Musculoskeletal: Negative.  Negative for myalgias.  Skin:  Negative for rash.  Neurological:  Negative for dizziness and headaches.  Endo/Heme/Allergies:  Does not bruise/bleed easily.  Psychiatric/Behavioral:  Negative for depression. The patient is not nervous/anxious.      Physical Exam BP 120/80   Pulse (!) 59   Temp (!) 97.5 F (36.4 C) (Temporal)   Ht 5' 4.25" (1.632 m)   Wt 251 lb 4 oz (114 kg)   SpO2 99%   BMI 42.79 kg/m    BP Readings from Last 3 Encounters:  01/26/22 120/80  01/21/21 140/90  07/09/20 118/60      Physical Exam Constitutional:      General: She is not in acute distress.    Appearance: She is well-developed. She is not diaphoretic.  HENT:     Head: Normocephalic and atraumatic.     Right Ear: External ear normal.     Left Ear: External ear normal.     Nose: Nose normal.  Eyes:     General: No scleral icterus.    Extraocular Movements: Extraocular movements intact.     Conjunctiva/sclera: Conjunctivae normal.  Cardiovascular:     Rate and Rhythm: Normal rate and regular  rhythm.     Heart sounds: No murmur heard. Pulmonary:     Effort: Pulmonary effort is normal. No respiratory distress.     Breath sounds: Normal breath sounds. No wheezing.  Abdominal:     General: Bowel sounds are normal. There is no distension.     Palpations: Abdomen is soft. There is no mass.     Tenderness: There is no abdominal tenderness. There is no guarding or rebound.  Musculoskeletal:        General: Normal range of motion.     Cervical back: Neck supple.  Lymphadenopathy:     Cervical: No cervical adenopathy.  Skin:    General: Skin is warm and dry.     Capillary Refill: Capillary refill takes less than 2 seconds.  Neurological:     Mental Status: She is alert and oriented to person, place, and time.     Deep Tendon Reflexes: Reflexes normal.  Psychiatric:        Mood and Affect: Mood normal.        Behavior: Behavior normal.     Results:  PHQ-9:     01/26/2022    9:01 AM 07/09/2020    8:01 AM 12/26/2019   10:46 AM  Depression screen PHQ 2/9  Decreased Interest 0 0 0  Down, Depressed, Hopeless 0 0 0  PHQ - 2 Score 0 0 0      Assessment: 61 y.o. No obstetric history on file. female here for routine annual physical examination.  Plan: Problem List Items Addressed This Visit       Other   Psychophysiological insomnia   Relevant Medications   traZODone (DESYREL) 50 MG tablet   Class 3 severe obesity due to excess calories without serious comorbidity with body mass index (BMI) of 40.0 to 44.9 in adult [facility])  Relevant Orders   Lipid panel   Comprehensive metabolic panel   CBC   Ankle swelling    Encouraged exercise, low salt, increased hydration, and compression socks.  Labs today to assess for possible causes.      Relevant Orders   TSH   Other Visit Diagnoses     Annual physical exam    -  Primary   Relevant Orders   TSH   Encounter for screening mammogram for malignant neoplasm of breast       Relevant Orders   MM 3D SCREEN BREAST  BILATERAL   Screening for colon cancer       Relevant Orders   Ambulatory referral to Gastroenterology       Screening: -- Blood pressure screen normal -- cholesterol screening: will obtain -- Weight screening: obese: discussed management options, including lifestyle, dietary, and exercise. -- Diabetes Screening: will obtain -- Nutrition: Encouraged healthy diet  The 10-year ASCVD risk score (Arnett DK, et al., 2019) is: 2.4%   Values used to calculate the score:     Age: 4 years     Sex: Female     Is Non-Hispanic African American: No     Diabetic: No     Tobacco smoker: No     Systolic Blood Pressure: 003 mmHg     Is BP treated: No     HDL Cholesterol: 43.9 mg/dL     Total Cholesterol: 163 mg/dL  -- Statin therapy for Age 65-75 with CVD risk >7.5%  Psych -- Depression screening (PHQ-9): negative   Safety -- tobacco screening: not using -- alcohol screening:  low-risk usage. -- no evidence of domestic violence or intimate partner violence.   Cancer Screening -- pap smear not collected per ASCCP guidelines -- family history of breast cancer screening: done. not at high risk. -- Mammogram - ordered -- Colon cancer (age 55+)-- ordered  Immunizations Immunization History  Administered Date(s) Administered   Influenza,inj,Quad PF,6+ Mos 05/12/2016, 05/18/2017, 07/15/2018, 05/10/2019, 05/07/2020   Influenza-Unspecified 08/27/2014, 05/18/2017   Moderna SARS-COV2 Booster Vaccination 08/07/2020   PFIZER(Purple Top)SARS-COV-2 Vaccination 10/19/2019, 11/16/2019   Tdap 01/17/2014   Zoster Recombinat (Shingrix) 07/28/2018, 09/28/2018    -- flu vaccine up to date -- TDAP q10 years up to date -- Shingles (age >69) up to date -- Covid-19 Vaccine up to date   Encouraged healthy diet and exercise. Encouraged regular vision and dental care.    Lesleigh Noe, MD

## 2022-01-27 ENCOUNTER — Telehealth: Payer: Self-pay

## 2022-01-27 NOTE — Telephone Encounter (Signed)
CALLED PATIENT NO ANSWER LEFT VOICEMAIL FOR A CALL BACK ? ?

## 2022-01-28 ENCOUNTER — Telehealth: Payer: Self-pay

## 2022-01-28 NOTE — Telephone Encounter (Signed)
CALLED PATIENT NO ANSWER LEFT VOICEMAIL FOR A CALL BACK LETTER SENT 

## 2022-02-03 DIAGNOSIS — G4733 Obstructive sleep apnea (adult) (pediatric): Secondary | ICD-10-CM | POA: Diagnosis not present

## 2022-06-08 DIAGNOSIS — G4733 Obstructive sleep apnea (adult) (pediatric): Secondary | ICD-10-CM | POA: Diagnosis not present

## 2022-06-17 DIAGNOSIS — H524 Presbyopia: Secondary | ICD-10-CM | POA: Diagnosis not present

## 2022-06-17 DIAGNOSIS — H5203 Hypermetropia, bilateral: Secondary | ICD-10-CM | POA: Diagnosis not present

## 2022-06-17 DIAGNOSIS — H52213 Irregular astigmatism, bilateral: Secondary | ICD-10-CM | POA: Diagnosis not present

## 2022-07-28 ENCOUNTER — Ambulatory Visit (INDEPENDENT_AMBULATORY_CARE_PROVIDER_SITE_OTHER): Payer: BC Managed Care – PPO | Admitting: Dermatology

## 2022-07-28 DIAGNOSIS — Z1283 Encounter for screening for malignant neoplasm of skin: Secondary | ICD-10-CM

## 2022-07-28 DIAGNOSIS — L821 Other seborrheic keratosis: Secondary | ICD-10-CM

## 2022-07-28 DIAGNOSIS — L408 Other psoriasis: Secondary | ICD-10-CM | POA: Diagnosis not present

## 2022-07-28 DIAGNOSIS — L814 Other melanin hyperpigmentation: Secondary | ICD-10-CM

## 2022-07-28 DIAGNOSIS — L578 Other skin changes due to chronic exposure to nonionizing radiation: Secondary | ICD-10-CM

## 2022-07-28 DIAGNOSIS — Z7189 Other specified counseling: Secondary | ICD-10-CM

## 2022-07-28 DIAGNOSIS — D229 Melanocytic nevi, unspecified: Secondary | ICD-10-CM

## 2022-07-28 MED ORDER — MOMETASONE FUROATE 0.1 % EX CREA: TOPICAL_CREAM | CUTANEOUS | 11 refills | Status: AC

## 2022-07-28 NOTE — Progress Notes (Signed)
Follow-Up Visit   Subjective  Monica Flowers is a 61 y.o. female who presents for the following: Annual Exam. Hx of Lentigo on her nose, patient had BBL laser on her nose in the past.  Would like that performed again today.  She was pleased with the results in the past.  She has history of having a procedure performed on the nose at [facility] dermatology leaving a scar there.  sHe has persistent brown Apsey proven lentigo remaining. The patient presents for Total-Body Skin Exam (TBSE) for skin cancer screening and mole check.  The patient has spots, moles and lesions to be evaluated, some may be new or changing and the patient has concerns that these could be cancer.   The following portions of the chart were reviewed this encounter and updated as appropriate:   Tobacco  Allergies  Meds  Problems  Med Hx  Surg Hx  Fam Hx     Review of Systems:  No other skin or systemic complaints except as noted in HPI or Assessment and Plan.  Objective  Well appearing patient in no apparent distress; mood and affect are within normal limits.  A full examination was performed including scalp, head, eyes, ears, nose, lips, neck, chest, axillae, abdomen, back, buttocks, bilateral upper extremities, bilateral lower extremities, hands, feet, fingers, toes, fingernails, and toenails. All findings within normal limits unless otherwise noted below.  ears, hairline scalp Mild pink scaly patches   Head - Anterior (Face) tan macule          Assessment & Plan  Sebopsoriasis ears, hairline scalp Sebo-Psoriasis is a chronic non-curable, but treatable genetic/hereditary disease that may have other systemic features affecting other organ systems such as joints (Psoriatic Arthritis). It is associated with an increased risk of inflammatory bowel disease, heart disease, non-alcoholic fatty liver disease, and depression.     Start Mometasone cream apply to affected skin qd 5 days week only, Avoid applying to  face, groin, and axilla. Use as directed. Long-term use can cause thinning of the skin.   Related Medications mometasone (ELOCON) 0.1 % cream Apply to affected skin 5 days a week prn  Lentigines Head - Anterior (Face) Biopsy proven Lentigo-benign  Patient had a procedure done in this area at another office [facility] dermatology  -causing a scar and persistent lentigo.  Advised scar will not resolve.  Nevertheless, she can have laser treatment to lighten the brown discoloration.  She has had that done in the past in our office with significant improvement and she was pleased.  She would like to repeat that treatment today. Advised of fee of $200 per treatment session.  She understands.  See photo of lentigo on the nose. See photo of settings for BBL laser treatment performed today.  Counseling for BBL / IPL / Laser and Coordination of Care Discussed the treatment option of Broad Band Light (BBL) Intense Pulsed Light (IPL) / Laser.  Typically we recommend at least 1-3 treatment sessions about 5-8 weeks apart for best results.  The patient's condition may require "maintenance treatments" in the future.  The fee for BBL / laser treatments is $350 per treatment session for the whole face.  A fee can be quoted for other parts of the body. Insurance typically does not pay for BBL/laser treatments and therefore the fee is an out-of-pocket cost.  Lentigines - Scattered tan macules - Due to sun exposure - Benign-appearing, observe - Recommend daily broad spectrum sunscreen SPF 30+ to sun-exposed areas, reapply every 2  hours as needed. - Call for any changes  Seborrheic Keratoses - Stuck-on, waxy, tan-brown papules and/or plaques  - Benign-appearing - Discussed benign etiology and prognosis. - Observe - Call for any changes  Melanocytic Nevi - Tan-brown and/or pink-flesh-colored symmetric macules and papules - Benign appearing on exam today - Observation - Call clinic for new or changing  moles - Recommend daily use of broad spectrum spf 30+ sunscreen to sun-exposed areas.   Hemangiomas - Red papules - Discussed benign nature - Observe - Call for any changes  Actinic Damage - Chronic condition, secondary to cumulative UV/sun exposure - diffuse scaly erythematous macules with underlying dyspigmentation - Recommend daily broad spectrum sunscreen SPF 30+ to sun-exposed areas, reapply every 2 hours as needed.  - Staying in the shade or wearing long sleeves, sun glasses (UVA+UVB protection) and wide brim hats (4-inch brim around the entire circumference of the hat) are also recommended for sun protection.  - Call for new or changing lesions.  Skin cancer screening performed today.   Return in about 1 year (around 07/29/2023) for TBSE.  IMarye Round, CMA, am acting as scribe for Sarina Ser, MD .  Documentation: I have reviewed the above documentation for accuracy and completeness, and I agree with the above.  Sarina Ser, MD

## 2022-07-28 NOTE — Patient Instructions (Signed)
Due to recent changes in healthcare laws, you may see results of your pathology and/or laboratory studies on MyChart before the doctors have had a chance to review them. We understand that in some cases there may be results that are confusing or concerning to you. Please understand that not all results are received at the same time and often the doctors may need to interpret multiple results in order to provide you with the best plan of care or course of treatment. Therefore, we ask that you please give us 2 business days to thoroughly review all your results before contacting the office for clarification. Should we see a critical lab result, you will be contacted sooner.   If You Need Anything After Your Visit  If you have any questions or concerns for your doctor, please call our main line at [phone] and press option 4 to reach your doctor's medical assistant. If no one answers, please leave a voicemail as directed and we will return your call as soon as possible. Messages left after 4 pm will be answered the following business day.   You may also send us a message via MyChart. We typically respond to MyChart messages within 1-2 business days.  For prescription refills, please ask your pharmacy to contact our office. Our fax number is [phone].  If you have an urgent issue when the clinic is closed that cannot wait until the next business day, you can page your doctor at the number below.    Please note that while we do our best to be available for urgent issues outside of office hours, we are not available 24/7.   If you have an urgent issue and are unable to reach us, you may choose to seek medical care at your doctor's office, retail clinic, urgent care center, or emergency room.  If you have a medical emergency, please immediately call 911 or go to the emergency department.  Pager Numbers  - Dr. Kowalski: [phone]  - Dr. Moye: [phone]  - Dr. Stewart:  [phone]  In the event of inclement weather, please call our main line at [phone] for an update on the status of any delays or closures.  Dermatology Medication Tips: Please keep the boxes that topical medications come in in order to help keep track of the instructions about where and how to use these. Pharmacies typically print the medication instructions only on the boxes and not directly on the medication tubes.   If your medication is too expensive, please contact our office at [phone] option 4 or send us a message through MyChart.   We are unable to tell what your co-pay for medications will be in advance as this is different depending on your insurance coverage. However, we may be able to find a substitute medication at lower cost or fill out paperwork to get insurance to cover a needed medication.   If a prior authorization is required to get your medication covered by your insurance company, please allow us 1-2 business days to complete this process.  Drug prices often vary depending on where the prescription is filled and some pharmacies may offer cheaper prices.  The website www.goodrx.com contains coupons for medications through different pharmacies. The prices here do not account for what the cost may be with help from insurance (it may be cheaper with your insurance), but the website can give you the price if you did not use any insurance.  - You can print the associated coupon and take it with   your prescription to the pharmacy.  - You may also stop by our office during regular business hours and pick up a GoodRx coupon card.  - If you need your prescription sent electronically to a different pharmacy, notify our office through [facility] MyChart or by phone at [phone] option 4.     Si Usted Necesita Algo Despus de Su Visita  Tambin puede enviarnos un mensaje a travs de MyChart. Por lo general respondemos a los mensajes de MyChart en el transcurso de 1 a 2  das hbiles.  Para renovar recetas, por favor pida a su farmacia que se ponga en contacto con nuestra oficina. Nuestro nmero de fax es el [phone].  Si tiene un asunto urgente cuando la clnica est cerrada y que no puede esperar hasta el siguiente da hbil, puede llamar/localizar a su doctor(a) al nmero que aparece a continuacin.   Por favor, tenga en cuenta que aunque hacemos todo lo posible para estar disponibles para asuntos urgentes fuera del horario de oficina, no estamos disponibles las 24 horas del da, los 7 das de la semana.   Si tiene un problema urgente y no puede comunicarse con nosotros, puede optar por buscar atencin mdica  en el consultorio de su doctor(a), en una clnica privada, en un centro de atencin urgente o en una sala de emergencias.  Si tiene una emergencia mdica, por favor llame inmediatamente al 911 o vaya a la sala de emergencias.  Nmeros de bper  - Dr. Kowalski: [phone]  - Dra. Moye: [phone]  - Dra. Stewart: [phone]  En caso de inclemencias del tiempo, por favor llame a nuestra lnea principal al [phone] para una actualizacin sobre el estado de cualquier retraso o cierre.  Consejos para la medicacin en dermatologa: Por favor, guarde las cajas en las que vienen los medicamentos de uso tpico para ayudarle a seguir las instrucciones sobre dnde y cmo usarlos. Las farmacias generalmente imprimen las instrucciones del medicamento slo en las cajas y no directamente en los tubos del medicamento.   Si su medicamento es muy caro, por favor, pngase en contacto con nuestra oficina llamando al [phone] y presione la opcin 4 o envenos un mensaje a travs de MyChart.   No podemos decirle cul ser su copago por los medicamentos por adelantado ya que esto es diferente dependiendo de la cobertura de su seguro. Sin embargo, es posible que podamos encontrar un medicamento sustituto a menor costo o llenar un formulario para que el  seguro cubra el medicamento que se considera necesario.   Si se requiere una autorizacin previa para que su compaa de seguros cubra su medicamento, por favor permtanos de 1 a 2 das hbiles para completar este proceso.  Los precios de los medicamentos varan con frecuencia dependiendo del lugar de dnde se surte la receta y alguna farmacias pueden ofrecer precios ms baratos.  El sitio web www.goodrx.com tiene cupones para medicamentos de diferentes farmacias. Los precios aqu no tienen en cuenta lo que podra costar con la ayuda del seguro (puede ser ms barato con su seguro), pero el sitio web puede darle el precio si no utiliz ningn seguro.  - Puede imprimir el cupn correspondiente y llevarlo con su receta a la farmacia.  - Tambin puede pasar por nuestra oficina durante el horario de atencin regular y recoger una tarjeta de cupones de GoodRx.  - Si necesita que su receta se enve electrnicamente a una farmacia diferente, informe a nuestra oficina a travs de MyChart de [place]   o por telfono llamando al [phone] y presione la opcin 4.  

## 2022-08-07 ENCOUNTER — Encounter: Payer: Self-pay | Admitting: Dermatology

## 2022-09-06 ENCOUNTER — Other Ambulatory Visit: Payer: Self-pay

## 2022-09-06 DIAGNOSIS — F5104 Psychophysiologic insomnia: Secondary | ICD-10-CM

## 2022-09-06 NOTE — Telephone Encounter (Signed)
Lvmtcb, sent mychart message  

## 2022-09-06 NOTE — Telephone Encounter (Signed)
Received refill request for Trazodone. Patient will need to set up [facility] appointment. Let me know once done and will send for approval on refill.

## 2022-09-15 NOTE — Telephone Encounter (Signed)
Patient has been scheduled.

## 2022-09-22 NOTE — Telephone Encounter (Signed)
Can we please call pt to clarify trazodone dosage? She has not responded to my [employer]. Thanks.

## 2022-09-22 NOTE — Telephone Encounter (Signed)
Called and spoke to patient she states she fluctuates with the dose however she more often takes 2 tab than 1 tab.

## 2022-09-23 MED ORDER — TRAZODONE HCL 50 MG PO TABS: 50.0000 mg | ORAL_TABLET | Freq: Every day | ORAL | 1 refills | Status: DC

## 2022-10-13 ENCOUNTER — Inpatient Hospital Stay: Payer: BC Managed Care – PPO | Attending: Oncology | Admitting: Licensed Clinical Social Worker

## 2022-10-13 ENCOUNTER — Encounter: Payer: Self-pay | Admitting: Licensed Clinical Social Worker

## 2022-10-13 DIAGNOSIS — Z803 Family history of malignant neoplasm of breast: Secondary | ICD-10-CM

## 2022-10-13 DIAGNOSIS — Z8481 Family history of carrier of genetic disease: Secondary | ICD-10-CM

## 2022-10-13 NOTE — Progress Notes (Signed)
REFERRING PROVIDER: Self-referred  PRIMARY PROVIDER:  Waunita Schooner, MD  PRIMARY REASON FOR VISIT:  1. Family history of gene mutation   2. Family history of breast cancer      HISTORY OF PRESENT ILLNESS:   Monica Flowers, a 62 y.o. female, was seen for a Monica Flowers cancer genetics consultation due to her mother's recent genetic testing that showed an ATM mutation.  Monica Flowers presents to clinic today to discuss the possibility of a hereditary predisposition to cancer, genetic testing, and to further clarify her future cancer risks, as well as potential cancer risks for family members.   CANCER HISTORY:  Monica Flowers is a 61 y.o. female with no personal history of cancer.    RISK FACTORS:  Menarche was at age 40.  First live birth at age 69.  Ovaries intact: yes.  Hysterectomy: no.  Colonoscopy: no; not examined. Mammogram within the last year: no. Number of breast biopsies: 0.  Past Medical History:  Diagnosis Date   Cough variant asthma     Past Surgical History:  Procedure Laterality Date   none      FAMILY HISTORY:  We obtained a detailed, 4-generation family history.  Significant diagnoses are listed below: Family History  Problem Relation Age of Onset   Hyperlipidemia Mother    Breast cancer Mother 13       ATM+   Bradycardia Mother        has a pacemaker   Idiopathic pulmonary fibrosis Father    Hypertension Father    Heart attack Father    Nephrolithiasis Maternal Grandmother    Breast cancer Maternal Grandmother 80   Nephrolithiasis Maternal Grandfather    Hypertension Maternal Grandfather    Kidney cancer Maternal Grandfather    Idiopathic pulmonary fibrosis Paternal Grandmother    Stroke Daughter    Prostate cancer Neg Hx    Bladder Cancer Neg Hx    Monica Flowers has 2 daughters, 13 and 58, no cancers.  [name] mother, Monica Flowers, had breast cancer at 52 and is living at 62. She recently tested positive for an ATM pathogenic variant. Maternal  grandmother had breast cancer at 57, grandfather had kidney cancer and died at 43.   Monica Flowers father died at 61 from idiopathic pulmonary fibrosis. Paternal grandmother also passed from this. No known cancers on this side of the family.  Monica Flowers is aware of previous family history of genetic testing for hereditary cancer risks. There is no reported Ashkenazi Jewish ancestry. There is no known consanguinity.    GENETIC COUNSELING ASSESSMENT: Monica Flowers is a 62 y.o. female with a family history of an ATM mutation. We, therefore, discussed and recommended the following at today's visit.   DISCUSSION: We discussed that approximately 10% of cancer is hereditary. We discussed the ATM gene in particular, noting an increased lifetime risk for breast cancer (21-33%) and other cancers as well. She has a 50% chance to have inherited this mutation from her mother.  Her paternal family history is not concerning for hereditary cancer. We discussed that testing is beneficial for several reasons including knowing about cancer risks, identifying potential screening and risk-reduction options that may be appropriate, and to understand if other family members could be at risk for cancer and allow them to undergo genetic testing.   We reviewed the characteristics, features and inheritance patterns of hereditary cancer syndromes. We also discussed genetic testing, including the appropriate family members to test, the process of testing, insurance coverage and turn-around-time  for results. We discussed the implications of a negative, positive and/or variant of uncertain significant result. We recommended Monica Flowers pursue genetic testing for the ATM gene.  Based on Monica Flowers's family history of an ATM mutation, she meets medical criteria for testing. Her testing will be performed through [facility]'s family variant testing program at no cost.   PLAN: After considering the risks, benefits, and limitations, Monica Flowers  provided informed consent to pursue genetic testing and the blood sample was sent to [facility] for analysis of the ATM gene. Results should be available within approximately 2-3 weeks' time, at which point they will be disclosed by telephone to Monica Flowers, as will any additional recommendations warranted by these results. Monica Flowers will receive a summary of her genetic counseling visit and a copy of her results once available. This information will also be available in Epic.   Monica Flowers's questions were answered to her satisfaction today. Our contact information was provided should additional questions or concerns arise. Thank you for the referral and allowing [place] to share in the care of your patient.   Faith Rogue, MS, [facility] Genetic Counselor [email or web address] Phone: [phone]  The patient was seen for a total of 12 minutes in virtual genetic counseling.  Dr. Grayland Ormond was available for discussion regarding this case.   _______________________________________________________________________ For Office Staff:  Number of people involved in session: 1 Was an Intern/ student involved with case: no

## 2022-10-19 ENCOUNTER — Inpatient Hospital Stay: Payer: BC Managed Care – PPO

## 2022-10-26 ENCOUNTER — Telehealth: Payer: Self-pay | Admitting: Licensed Clinical Social Worker

## 2022-11-06 ENCOUNTER — Other Ambulatory Visit: Payer: Self-pay | Admitting: Family

## 2022-11-06 DIAGNOSIS — F5104 Psychophysiologic insomnia: Secondary | ICD-10-CM

## 2022-12-05 DIAGNOSIS — G4733 Obstructive sleep apnea (adult) (pediatric): Secondary | ICD-10-CM | POA: Diagnosis not present

## 2022-12-15 ENCOUNTER — Encounter: Payer: Self-pay | Admitting: Licensed Clinical Social Worker

## 2022-12-29 ENCOUNTER — Encounter: Payer: Self-pay | Admitting: Licensed Clinical Social Worker

## 2022-12-29 ENCOUNTER — Ambulatory Visit: Payer: Self-pay | Admitting: Licensed Clinical Social Worker

## 2022-12-29 ENCOUNTER — Other Ambulatory Visit: Payer: Self-pay | Admitting: Family

## 2022-12-29 DIAGNOSIS — Z1379 Encounter for other screening for genetic and chromosomal anomalies: Secondary | ICD-10-CM

## 2022-12-29 DIAGNOSIS — Z1509 Genetic susceptibility to other malignant neoplasm: Secondary | ICD-10-CM | POA: Insufficient documentation

## 2022-12-29 DIAGNOSIS — F5104 Psychophysiologic insomnia: Secondary | ICD-10-CM

## 2022-12-29 NOTE — Telephone Encounter (Signed)
Attempted to reach patient multiple times with results by phone. Also sent patient an email and MyChart message, no response. Will send patient letter in the mail with results and request call back to review.

## 2022-12-29 NOTE — Progress Notes (Addendum)
Genetic Test Results  HPI:   Ms. Fistler was previously seen in the [place] due to a family history of an ATM mutation and concerns regarding a hereditary predisposition to cancer. Please refer to our prior cancer genetics clinic note for more information regarding our discussion, assessment and recommendations, at the time. We attempted to reach Ms. Perlman with her results multiple times by phone, email and MyChart message. We will send her these notes and her results via mail. These results and recommendations are discussed in more detail below.  CANCER HISTORY:  Oncology History   No history exists.    FAMILY HISTORY:  We obtained a detailed, 4-generation family history.  Significant diagnoses are listed below: Family History  Problem Relation Age of Onset   Hyperlipidemia Mother    Breast cancer Mother 8       ATM+   Bradycardia Mother        has a pacemaker   Idiopathic pulmonary fibrosis Father    Hypertension Father    Heart attack Father    Nephrolithiasis Maternal Grandmother    Breast cancer Maternal Grandmother 54   Nephrolithiasis Maternal Grandfather    Hypertension Maternal Grandfather    Kidney cancer Maternal Grandfather    Idiopathic pulmonary fibrosis Paternal Grandmother    Stroke Daughter    Prostate cancer Neg Hx    Bladder Cancer Neg Hx    Ms. Harsha has 2 daughters, 2 and 2, no cancers.   Ms. Boodoo mother, Para March, had breast cancer at 2 and is living at 76. She recently tested positive for an ATM pathogenic variant. Maternal grandmother had breast cancer at 78, grandfather had kidney cancer and died at 67.    Ms. Mansfield's father died at 63 from idiopathic pulmonary fibrosis. Paternal grandmother also passed from this. No known cancers on this side of the family.   Ms. Measel is aware of previous family history of genetic testing for hereditary cancer risks. There is no reported Ashkenazi Jewish ancestry. There is no known  consanguinity.      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 Specifically, this variant is [id number] (Intronic) .  The test report has been scanned into EPIC and is located under the Molecular Pathology section of the Results Review tab.  A portion of the result report is included below for reference. Genetic testing reported out on 10/26/2022.  Cancer Risks for ATM: Women have a 20-40% lifetime risk of breast cancer. 2-3% risk for epithelial ovarian cancer 5-10% risk for pancreatic cancer  There is emerging evidence suggesting an increased risk for prostate cancer.  Research is continuing to help learn more about the cancers associated with ATM pathogenic variants and what the exact risks are to develop these cancers. Management Recommendations:  Breast Screening/Risk Reduction: Breast cancer screening includes: Breast awareness beginning at age 54 Monthly self-breast examination beginning at age 69 Clinical breast examination every 6-12 months beginning at age 72 or at the age of the earliest diagnosed breast cancer in the family, if onset was before age 49 Annual mammogram with consideration of tomosynthesis starting at age 48 or 10 years prior to the youngest age of diagnosis, whichever comes first Consider breast MRI with and without contrast starting at age 51-35 Consider additional risk reducing strategies such as Tamoxifen Evidence is insufficient for a prophylactic risk-reducing mastectomy, manage based on family history  For patients who  are treated for breast cancer and have not had bilateral mastectomy, screening should continue as described  Ovarian Cancer Screening/Risk Reduction: Evidence insufficient for risk-reducing salpingo oophorectomy; manage based on family history If there is a family history of ovarian cancer, have a discussion with your physician about the  benefits and limitations of screening and risk reducing strategies  Pancreatic Cancer Screening/Risk Reduction: Avoid smoking, heavy alcohol use, and obesity. Pancreatic cancer screening may be considered in those with a family history of pancreatic cancer (first- or second-degree relative). Screening includes annual endoscopic ultrasound (preferred) and/or MRI of the pancreas starting at age 46 or 32 years younger than the earliest age diagnosis in the family.  Annual concurrent CA19-9 testing may also be considered.  Prostate Cancer Screening: Consider beginning annual PSA blood test and digital rectal exams at age 61.  Additional considerations: There is insufficient evidence to recommend against radiation therapy.  Individuals with a single pathogenic ATM variant are also carriers of ataxia telangiectasia. Ataxia telangiectasia is associated with childhood cancer risks as well as other medical problems (such as difficulty with movement, balance and coordination problems, neuropathy, and weakened immunity). For there to be a risk of ataxia telangiectasia in offspring, both the patient and their partner would each have to carry a pathogenic variant in ATM; in this case, the risk to have an affected child is 25%.  This information is based on current understanding of the gene and may change in the future.  Implications for Family Members: Hereditary predisposition to cancer due to pathogenic variants in the ATM gene has autosomal dominant inheritance. This means that an individual with a pathogenic variant has a 50% chance of passing the condition on to his/her offspring. Identification of a pathogenic variant allows for the recognition of at-risk relatives who can pursue testing for the familial variant.   Family members are encouraged to consider genetic testing for this familial pathogenic variant. As there are generally no childhood cancer risks associated with pathogenic variants in the ATM  gene, individuals in the family are not recommended to have testing until they reach at least 60 years of age. They may contact our office at [phone] for more information or to schedule an appointment. Complimentary testing for the familial variant is available for 90 days. Family members who live outside of the area are encouraged to find a genetic counselor in their area by visiting: [email or web address].  Resources: FORCE (Facing Our Risk of Cancer Empowered) is a resource for those with a hereditary predisposition to develop cancer.  FORCE provides information about risk reduction, advocacy, legislation, and clinical trials.  Additionally, FORCE provides a platform for collaboration and support; which includes: peer navigation, message boards, local support groups, a toll-free helpline, research registry and recruitment, advocate training, published medical research, webinars, brochures, mastectomy photos, and more.  For more information, visit www.facingourrisk.org  PLAN: 1. These results will be made available to her PCP, Gweneth Dimitri, MD and Mort Sawyers FNP who she has an upcoming appointment with in June.  She may consider a referral to our high risk breast clinic if she is interested.   2. Ms. Pfenning plans to discuss these results with her family and will reach out to [place] if we can be of any assistance in coordinating genetic testing for any of her relatives.    We encourage Ms. Hogenmiller to remain in contact with [place] on an annual basis so we can update her personal and family histories, and let her know of advances in  cancer genetics that may benefit the family. Our contact number was provided. Ms. Stukey knows she is welcome to call anytime with additional questions.   Lacy Duverney, MS, [facility] Genetic Counselor [email or web address] Phone: [phone]

## 2022-12-30 NOTE — Telephone Encounter (Signed)
Refill traZODone (DESYREL) 50 MG tablet   LR- 11/08/22 ( 180 tabs/ 1 refill) LV- 01/26/22 NV- 02/01/23

## 2023-01-31 ENCOUNTER — Encounter: Payer: BC Managed Care – PPO | Admitting: Family Medicine

## 2023-02-01 ENCOUNTER — Other Ambulatory Visit: Payer: Self-pay | Admitting: Family

## 2023-02-01 ENCOUNTER — Encounter: Payer: Self-pay | Admitting: Family

## 2023-02-01 ENCOUNTER — Ambulatory Visit (INDEPENDENT_AMBULATORY_CARE_PROVIDER_SITE_OTHER): Payer: BC Managed Care – PPO | Admitting: Family

## 2023-02-01 VITALS — BP 130/82 | HR 72 | Temp 97.5°F | Ht 65.0 in | Wt 264.0 lb

## 2023-02-01 DIAGNOSIS — Z0000 Encounter for general adult medical examination without abnormal findings: Secondary | ICD-10-CM

## 2023-02-01 DIAGNOSIS — E66813 Obesity, class 3: Secondary | ICD-10-CM

## 2023-02-01 DIAGNOSIS — Z6841 Body Mass Index (BMI) 40.0 and over, adult: Secondary | ICD-10-CM

## 2023-02-01 DIAGNOSIS — F5104 Psychophysiologic insomnia: Secondary | ICD-10-CM | POA: Diagnosis not present

## 2023-02-01 DIAGNOSIS — E559 Vitamin D deficiency, unspecified: Secondary | ICD-10-CM

## 2023-02-01 DIAGNOSIS — Z13220 Encounter for screening for lipoid disorders: Secondary | ICD-10-CM | POA: Diagnosis not present

## 2023-02-01 DIAGNOSIS — Z1211 Encounter for screening for malignant neoplasm of colon: Secondary | ICD-10-CM

## 2023-02-01 DIAGNOSIS — Z9189 Other specified personal risk factors, not elsewhere classified: Secondary | ICD-10-CM

## 2023-02-01 DIAGNOSIS — J45991 Cough variant asthma: Secondary | ICD-10-CM | POA: Diagnosis not present

## 2023-02-01 DIAGNOSIS — Z0001 Encounter for general adult medical examination with abnormal findings: Secondary | ICD-10-CM | POA: Insufficient documentation

## 2023-02-01 DIAGNOSIS — R635 Abnormal weight gain: Secondary | ICD-10-CM | POA: Insufficient documentation

## 2023-02-01 DIAGNOSIS — N951 Menopausal and female climacteric states: Secondary | ICD-10-CM

## 2023-02-01 DIAGNOSIS — Z1231 Encounter for screening mammogram for malignant neoplasm of breast: Secondary | ICD-10-CM

## 2023-02-01 LAB — BASIC METABOLIC PANEL
BUN: 16 mg/dL (ref 6–23)
CO2: 27 mEq/L (ref 19–32)
Calcium: 9.6 mg/dL (ref 8.4–10.5)
Chloride: 102 mEq/L (ref 96–112)
Creatinine, Ser: 0.98 mg/dL (ref 0.40–1.20)
GFR: 62.98 mL/min (ref >60.00)
Glucose, Bld: 86 mg/dL (ref 70–99)
Potassium: 4.1 mEq/L (ref 3.5–5.1)
Sodium: 137 mEq/L (ref 135–145)

## 2023-02-01 LAB — LIPID PANEL
Cholesterol: 183 mg/dL (ref 0–200)
HDL: 53.4 mg/dL (ref >39.00)
LDL Cholesterol: 108 mg/dL — ABNORMAL HIGH (ref 0–99)
NonHDL: 129.25
Total CHOL/HDL Ratio: 3
Triglycerides: 107 mg/dL (ref 0.0–149.0)
VLDL: 21.4 mg/dL (ref 0.0–40.0)

## 2023-02-01 LAB — CBC
HCT: 43.3 % (ref 36.0–46.0)
Hemoglobin: 14.4 g/dL (ref 12.0–15.0)
MCHC: 33.1 g/dL (ref 30.0–36.0)
MCV: 93.7 fl (ref 78.0–100.0)
Platelets: 317 10*3/uL (ref 150.0–400.0)
RBC: 4.62 Mil/uL (ref 3.87–5.11)
RDW: 12.6 % (ref 11.5–15.5)
WBC: 7.2 10*3/uL (ref 4.0–10.5)

## 2023-02-01 LAB — VITAMIN D 25 HYDROXY (VIT D DEFICIENCY, FRACTURES): VITD: 29.77 ng/mL — ABNORMAL LOW (ref 30.00–100.00)

## 2023-02-01 LAB — TSH: TSH: 1.63 u[IU]/mL (ref 0.35–5.50)

## 2023-02-01 MED ORDER — CHOLECALCIFEROL 1.25 MG (50000 UT) PO TABS
1.0000 | ORAL_TABLET | ORAL | 0 refills | Status: DC
Start: 2023-02-01 — End: 2023-05-24

## 2023-02-01 NOTE — Assessment & Plan Note (Signed)
Ordered vitamin d pending results.   

## 2023-02-01 NOTE — Progress Notes (Signed)
Subjective:  Patient ID: Monica Flowers, female    DOB: August 30, 1960  Age: 63 y.o. MRN: [id number]  Patient Care Team: Caralee Ates, MD as Consulting Physician (Pulmonary Disease) Blair Promise, OD (Optometry)   CC:  Chief Complaint  Patient presents with   Establish Care   Obesity   Amenorrhea    Has cycle every 9-12 months     HPI Monica Flowers is a 62 y.o. female who presents today for an annual physical exam as well as a TOC visit. Prior provider with Dr. Gweneth Dimitri    She reports consuming a general diet.  [employer] fitness on the oculus five days a week  She generally feels well. She reports sleeping fairly well uses medications for this. She does have additional problems to discuss today.   Diet: cherrios in the am.  Chik fila one morning a week.  Lunch: sandwich with bag of potato chips  Supper: pizza, chicken, steak, salads, alternating (corns , green beans)   Vision:Within last year Dental:No regular dental care STD:The patient denies history of sexually transmitted disease.  Mammogram: 02/02/2019 last at [facility] health care  Last pap: 07/28/2018 overdue  Colonoscopy:never had one open to a referral  Pt is with acute concerns.   Periods: irregular. Last bleed was last week x 3 days. Will go months at a time without it. Prior to this was 11 months.  Hot flashes on occasion but she does have high temp in the house due to thermostat.   Lab Results  Component Value Date   TSH 1.65 01/26/2022   Wt Readings from Last 3 Encounters:  02/01/23 264 lb (119.7 kg)  01/26/22 251 lb 4 oz (114 kg)  01/21/21 245 lb (111.1 kg)    Constipation: docusate sodium daily, uses miralax when skipping for a few days.   Advanced Directives Patient does not have advanced directives.   DEPRESSION SCREENING    02/01/2023    9:18 AM 01/26/2022    9:01 AM 07/09/2020    8:01 AM 12/26/2019   10:46 AM  PHQ 2/9 Scores  PHQ - 2 Score 0 0 0 0  PHQ- 9 Score 4         ROS: Negative unless specifically indicated above in HPI.    Current Outpatient Medications:    diphenhydramine-acetaminophen (TYLENOL PM) 25-500 MG TABS tablet, Take 1 tablet by mouth at bedtime as needed (uses in alternation with Trazodone). , Disp: , Rfl:    docusate sodium (COLACE) 100 MG capsule, Take by mouth. , Disp: , Rfl:    Fexofenadine HCl (ALLERGY 24-HR PO), Take 2 tablets by mouth at bedtime. Allertec ([employer]) , Disp: , Rfl:    mometasone (ELOCON) 0.1 % cream, Apply to affected skin 5 days a week prn, Disp: 45 g, Rfl: 11   traZODone (DESYREL) 50 MG tablet, TAKE ONE TO TWO TABLETS (50 - 100 MG TOTAL) BY MOUTH AT BEDTIME, Disp: 180 tablet, Rfl: 1   triamcinolone cream (KENALOG) 0.1 %, Apply 1 application topically 2 (two) times daily., Disp: 30 g, Rfl: 0    Objective:    BP 130/82   Pulse 72   Temp (!) 97.5 F (36.4 C) (Temporal)   Ht 5\' 5"  (1.651 m)   Wt 264 lb (119.7 kg)   LMP 01/25/2023   SpO2 96%   BMI 43.93 kg/m      Physical Exam Constitutional:      General: She is not in acute distress.    Appearance: Normal  appearance. She is obese. She is not ill-appearing.  HENT:     Head: Normocephalic.     Right Ear: Tympanic membrane normal.     Left Ear: Tympanic membrane normal.     Nose: Nose normal.     Mouth/Throat:     Mouth: Mucous membranes are moist.  Eyes:     Extraocular Movements: Extraocular movements intact.     Pupils: Pupils are equal, round, and reactive to light.  Cardiovascular:     Rate and Rhythm: Normal rate and regular rhythm.  Pulmonary:     Effort: Pulmonary effort is normal.     Breath sounds: Normal breath sounds.  Abdominal:     General: Abdomen is flat. Bowel sounds are normal.     Palpations: Abdomen is soft.     Tenderness: There is no guarding or rebound.  Musculoskeletal:        General: Normal range of motion.     Cervical back: Normal range of motion.  Skin:    General: Skin is warm.     Capillary Refill: Capillary  refill takes less than 2 seconds.  Neurological:     General: No focal deficit present.     Mental Status: She is alert.  Psychiatric:        Mood and Affect: Mood normal.        Behavior: Behavior normal.        Thought Content: Thought content normal.        Judgment: Judgment normal.          Assessment & Plan:  Encounter for screening mammogram for malignant neoplasm of breast -     MM 3D DIAGNOSTIC MAMMOGRAM BILATERAL BREAST W/IMPLANT; Future  At high risk for breast cancer -     MM 3D DIAGNOSTIC MAMMOGRAM BILATERAL BREAST W/IMPLANT; Future  Screening for colon cancer -     Ambulatory referral to Gastroenterology  Morbid obesity [facility]) Assessment & Plan: Pt advised to work on diet and exercise as tolerated Referral placed for weight loss clinic.   Orders: -     Amb Ref to Medical Weight Management  Weight gain -     TSH  Screening for lipoid disorders -     Lipid panel  Encounter for general adult medical examination with abnormal findings Assessment & Plan: Patient Counseling(The following topics were reviewed):  Preventative care handout given to pt  Health maintenance and immunizations reviewed. Please refer to Health maintenance section. Pt advised on safe sex, wearing seatbelts in car, and proper nutrition labwork ordered today for annual Dental health: Discussed importance of regular tooth brushing, flossing, and dental visits.   Orders: -     Lipid panel -     Basic metabolic panel -     CBC -     TSH  Vitamin D deficiency Assessment & Plan: Ordered vitamin d pending results.    Orders: -     VITAMIN D 25 Hydroxy (Vit-D Deficiency, Fractures)  Class 3 severe obesity due to excess calories without serious comorbidity with body mass index (BMI) of 40.0 to 44.9 in adult [facility]) Assessment & Plan: Went over meal plan/nutrition as well as exercise plan.  Referral placed for weight loss management.  Did talk with pt about carb heavy diet  currently and ways to improve diet.     Psychophysiological insomnia Assessment & Plan: Trazodone prn    Cough variant asthma Assessment & Plan: Stable    Menopausal symptoms Assessment & Plan: Did  reassure that bleeding irregularly is common in menopause.  Advised to note any worsening heavy or painful bleeding.        Follow-up: Return in about 1 year (around 02/01/2024) for f/u CPE.   Mort Sawyers, FNP

## 2023-02-01 NOTE — Assessment & Plan Note (Signed)
Pt advised to work on diet and exercise as tolerated Referral placed for weight loss clinic.  

## 2023-02-01 NOTE — Assessment & Plan Note (Signed)
Went over meal plan/nutrition as well as exercise plan.  Referral placed for weight loss management.  Did talk with pt about carb heavy diet currently and ways to improve diet.

## 2023-02-01 NOTE — Assessment & Plan Note (Signed)
Did reassure that bleeding irregularly is common in menopause.  Advised to note any worsening heavy or painful bleeding.

## 2023-02-01 NOTE — Assessment & Plan Note (Signed)
Patient Counseling(The following topics were reviewed): ? Preventative care handout given to pt  ?Health maintenance and immunizations reviewed. Please refer to Health maintenance section. ?Pt advised on safe sex, wearing seatbelts in car, and proper nutrition ?labwork ordered today for annual ?Dental health: Discussed importance of regular tooth brushing, flossing, and dental visits. ? ? ?

## 2023-02-01 NOTE — Patient Instructions (Addendum)
A referral was placed today. Please let us know if you have not heard back within 2 weeks about the referral.  ------------------------------------  A referral was placed today for colonoscopy.  Please let us know if you have not heard back within 2 weeks about the referral.  ------------------------------------  I have sent an electronic order over to your preferred location for the following:   []   2D Mammogram  [x]   3D Mammogram  []   Bone Density   Please give this center a call to get scheduled at your convenience.  [x]   [facility]  [address] [place] [address]  [phone]  Make sure to wear two piece  clothing  No lotions powders or deodorants the day of the appointment Make sure to bring picture ID and insurance card.  Bring list of medications you are currently taking including any supplements.    ------------------------------------

## 2023-02-01 NOTE — Assessment & Plan Note (Signed)
Trazodone prn 

## 2023-02-01 NOTE — Assessment & Plan Note (Signed)
Stable

## 2023-02-03 ENCOUNTER — Telehealth: Payer: Self-pay | Admitting: Licensed Clinical Social Worker

## 2023-02-03 NOTE — Telephone Encounter (Signed)
Monica Flowers called to discuss genetic test results further. She would like to be referred to high risk breast clinic. Referral placed today.   Lacy Duverney, MS, [facility] Genetic Counselor [email or web address] Phone: [phone]

## 2023-02-17 ENCOUNTER — Inpatient Hospital Stay: Payer: BC Managed Care – PPO

## 2023-02-17 ENCOUNTER — Inpatient Hospital Stay: Payer: BC Managed Care – PPO | Attending: Oncology | Admitting: Oncology

## 2023-02-17 ENCOUNTER — Encounter: Payer: Self-pay | Admitting: Oncology

## 2023-02-17 VITALS — BP 154/81 | HR 64 | Temp 98.1°F | Ht 65.0 in | Wt 264.4 lb

## 2023-02-17 DIAGNOSIS — Z803 Family history of malignant neoplasm of breast: Secondary | ICD-10-CM

## 2023-02-17 DIAGNOSIS — Z1509 Genetic susceptibility to other malignant neoplasm: Secondary | ICD-10-CM | POA: Diagnosis not present

## 2023-02-17 DIAGNOSIS — Z1501 Genetic susceptibility to malignant neoplasm of breast: Secondary | ICD-10-CM | POA: Insufficient documentation

## 2023-02-17 DIAGNOSIS — Z8051 Family history of malignant neoplasm of kidney: Secondary | ICD-10-CM | POA: Diagnosis not present

## 2023-02-18 NOTE — Progress Notes (Addendum)
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  She currently feels well and is asymptomatic.  She has no neurologic complaints.  She denies any recent fevers or illnesses.  She has a good appetite and denies weight loss.  She has no chest pain, shortness of breath, cough, or hemoptysis.  She denies any nausea, vomiting, constipation, or diarrhea.  She has no urinary complaints.  Patient feels at her baseline and offers no specific complaints today.  REVIEW OF SYSTEMS:   Review of Systems  Constitutional: Negative.  Negative for fever, malaise/fatigue and weight loss.  Respiratory: Negative.  Negative for cough, hemoptysis and shortness of breath.   Cardiovascular: Negative.  Negative for chest pain and leg swelling.  Gastrointestinal: Negative.  Negative for abdominal pain.  Genitourinary: Negative.  Negative for dysuria.  Musculoskeletal: Negative.  Negative for back pain.  Skin: Negative.  Negative for rash.  Neurological: Negative.  Negative for dizziness, focal weakness, weakness and headaches.  Psychiatric/Behavioral: Negative.  The patient is not nervous/anxious.     As per HPI. Otherwise, a complete review of systems is negative.  PAST MEDICAL HISTORY: Past Medical History:  Diagnosis Date   Cough variant asthma     PAST SURGICAL HISTORY: Past  Surgical History:  Procedure Laterality Date   NO PAST SURGERIES      FAMILY HISTORY: Family History  Problem Relation Age of Onset   Hyperlipidemia Mother    Breast cancer Mother 12       ATM+   Bradycardia Mother        has a pacemaker   Idiopathic pulmonary fibrosis Father    Hypertension Father    Heart attack Father 73 - 38   Nephrolithiasis Maternal Grandmother    Breast cancer Maternal Grandmother 28   Nephrolithiasis Maternal Grandfather    Hypertension Maternal Grandfather    Kidney cancer Maternal Grandfather    Idiopathic pulmonary fibrosis Paternal Grandmother    Stroke Daughter    Anxiety disorder Daughter    Prostate cancer Neg Hx    Bladder Cancer Neg Hx     ADVANCED DIRECTIVES (Y/N):  N  HEALTH MAINTENANCE: Social History   Tobacco Use   Smoking status: Never   Smokeless tobacco: Never  Vaping Use   Vaping status: Never Used  Substance Use Topics   Alcohol use: Yes    Comment: 1-2 drinks during the summer on the weekend   Drug use: No     Colonoscopy:  PAP:  Bone density:  Lipid panel:  Allergies  Allergen Reactions   Amoxicillin-Pot Clavulanate Rash    Current Outpatient Medications  Medication Sig Dispense Refill   Cholecalciferol 1.25 MG (50000 UT) TABS Take 1 tablet by mouth once a week. 8 tablet 0   diphenhydramine-acetaminophen (TYLENOL PM) 25-500 MG TABS tablet Take 1 tablet by mouth at bedtime as needed (uses in alternation with Trazodone).      docusate sodium (COLACE) 100 MG capsule Take  by mouth.      Fexofenadine HCl (ALLERGY 24-HR PO) Take 2 tablets by mouth at bedtime. Allertec ([employer])      mometasone (ELOCON) 0.1 % cream Apply to affected skin 5 days a week prn 45 g 11   traZODone (DESYREL) 50 MG tablet TAKE ONE TO TWO TABLETS (50 - 100 MG TOTAL) BY MOUTH AT BEDTIME 180 tablet 1   triamcinolone cream (KENALOG) 0.1 % Apply 1 application topically 2 (two) times daily. 30 g 0   No current facility-administered medications  for this visit.    OBJECTIVE: Vitals:   02/17/23 1333  BP: (!) 154/81  Pulse: 64  Temp: 98.1 F (36.7 C)  SpO2: 100%     Body mass index is 44 kg/m.    ECOG FS:0 - Asymptomatic  General: Well-developed, well-nourished, no acute distress. Eyes: Pink conjunctiva, anicteric sclera. HEENT: Normocephalic, moist mucous membranes. Lungs: No audible wheezing or coughing. Heart: Regular rate and rhythm. Abdomen: Soft, nontender, no obvious distention. Musculoskeletal: No edema, cyanosis, or clubbing. Neuro: Alert, answering all questions appropriately. Cranial nerves grossly intact. Skin: No rashes or petechiae noted. Psych: Normal affect. Lymphatics: No cervical, calvicular, axillary or inguinal LAD.   LAB RESULTS:  Lab Results  Component Value Date   NA 137 02/01/2023   K 4.1 02/01/2023   CL 102 02/01/2023   CO2 27 02/01/2023   GLUCOSE 86 02/01/2023   BUN 16 02/01/2023   CREATININE 0.98 02/01/2023   CALCIUM 9.6 02/01/2023   PROT 6.4 01/26/2022   ALBUMIN 3.8 01/26/2022   AST 15 01/26/2022   ALT 17 01/26/2022   ALKPHOS 85 01/26/2022   BILITOT 0.6 01/26/2022    Lab Results  Component Value Date   WBC 7.2 02/01/2023   NEUTROABS 5.9 12/26/2019   HGB 14.4 02/01/2023   HCT 43.3 02/01/2023   MCV 93.7 02/01/2023   PLT 317.0 02/01/2023     STUDIES: No results found.  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  We discussed at length taking chemoprophylaxis with tamoxifen for a total of 5 years to reduce her risk of developing breast cancer.  Patient reports her mother was on tamoxifen previously and had a significant number of side effects related to treatment.  She is hesitant to take any additional medications, including  tamoxifen.  She plans to think about it further and will call clinic with her final decision, although admitted she likely will not take tamoxifen.  We also discussed at length breast imaging and it was impressed upon her to continue her yearly screening mammograms and that she should not miss any imaging.  Breast MRI is suggested by NCCN guidelines, but frequency is not mentioned.  We discussed possibly getting a breast MRI every other year alternating with mammograms.  No intervention is needed at this time.  No follow-up has been scheduled, but will arrange for evaluation if patient elects to initiate tamoxifen.  I spent a total of 60 minutes reviewing chart data, face-to-face evaluation with the patient, counseling and coordination of care as detailed above.   Patient expressed understanding and was in agreement with this plan. She also understands that She can call clinic at any time with any questions, concerns, or complaints.    Jeralyn Ruths,  MD   02/18/2023 2:14 PM  Addendum: Patient called clinic and has elected not to pursue prophylactic tamoxifen.  No further follow-up is necessary.  Continue routine follow-up with primary care.    Jeralyn Ruths, MD 03/21/23 12:48 PM

## 2023-02-23 DIAGNOSIS — Z1231 Encounter for screening mammogram for malignant neoplasm of breast: Secondary | ICD-10-CM | POA: Diagnosis not present

## 2023-03-03 DIAGNOSIS — Z0289 Encounter for other administrative examinations: Secondary | ICD-10-CM

## 2023-03-09 ENCOUNTER — Encounter (INDEPENDENT_AMBULATORY_CARE_PROVIDER_SITE_OTHER): Payer: Self-pay | Admitting: Family Medicine

## 2023-03-10 ENCOUNTER — Encounter (INDEPENDENT_AMBULATORY_CARE_PROVIDER_SITE_OTHER): Payer: Self-pay | Admitting: Family Medicine

## 2023-03-11 ENCOUNTER — Encounter: Payer: Self-pay | Admitting: Oncology

## 2023-03-14 ENCOUNTER — Encounter (INDEPENDENT_AMBULATORY_CARE_PROVIDER_SITE_OTHER): Payer: Self-pay

## 2023-03-26 ENCOUNTER — Other Ambulatory Visit: Payer: Self-pay | Admitting: Family

## 2023-03-26 DIAGNOSIS — E559 Vitamin D deficiency, unspecified: Secondary | ICD-10-CM

## 2023-04-20 DIAGNOSIS — Z09 Encounter for follow-up examination after completed treatment for conditions other than malignant neoplasm: Secondary | ICD-10-CM | POA: Diagnosis not present

## 2023-04-20 DIAGNOSIS — Z7902 Long term (current) use of antithrombotics/antiplatelets: Secondary | ICD-10-CM | POA: Diagnosis not present

## 2023-04-20 DIAGNOSIS — Z86010 Personal history of colonic polyps: Secondary | ICD-10-CM | POA: Diagnosis not present

## 2023-04-20 DIAGNOSIS — Z539 Procedure and treatment not carried out, unspecified reason: Secondary | ICD-10-CM | POA: Diagnosis not present

## 2023-04-21 ENCOUNTER — Ambulatory Visit: Payer: BC Managed Care – PPO | Admitting: Dermatology

## 2023-05-05 ENCOUNTER — Ambulatory Visit (INDEPENDENT_AMBULATORY_CARE_PROVIDER_SITE_OTHER): Payer: BC Managed Care – PPO | Admitting: Family Medicine

## 2023-05-05 ENCOUNTER — Encounter (INDEPENDENT_AMBULATORY_CARE_PROVIDER_SITE_OTHER): Payer: Self-pay | Admitting: Family Medicine

## 2023-05-05 VITALS — BP 130/82 | HR 63 | Temp 97.5°F | Ht 64.0 in | Wt 259.0 lb

## 2023-05-05 DIAGNOSIS — G4733 Obstructive sleep apnea (adult) (pediatric): Secondary | ICD-10-CM

## 2023-05-05 DIAGNOSIS — E7800 Pure hypercholesterolemia, unspecified: Secondary | ICD-10-CM | POA: Diagnosis not present

## 2023-05-05 DIAGNOSIS — R5383 Other fatigue: Secondary | ICD-10-CM

## 2023-05-05 DIAGNOSIS — Z1331 Encounter for screening for depression: Secondary | ICD-10-CM

## 2023-05-05 DIAGNOSIS — E669 Obesity, unspecified: Secondary | ICD-10-CM | POA: Diagnosis not present

## 2023-05-05 DIAGNOSIS — Z87442 Personal history of urinary calculi: Secondary | ICD-10-CM

## 2023-05-05 DIAGNOSIS — R0602 Shortness of breath: Secondary | ICD-10-CM

## 2023-05-05 DIAGNOSIS — E559 Vitamin D deficiency, unspecified: Secondary | ICD-10-CM | POA: Diagnosis not present

## 2023-05-05 DIAGNOSIS — Z6841 Body Mass Index (BMI) 40.0 and over, adult: Secondary | ICD-10-CM

## 2023-05-05 NOTE — Progress Notes (Signed)
Chief Complaint:   OBESITY Monica Flowers (MR# [id number]) is a 62 y.o. female who presents for evaluation and treatment of obesity and related comorbidities. Current BMI is Body mass index is 44.46 kg/m. Monica Flowers has been struggling with her weight for many years and has been unsuccessful in either losing weight, maintaining weight loss, or reaching her healthy weight goal.  Monica Flowers is currently in the action stage of change and ready to dedicate time achieving and maintaining a healthier weight. Monica Flowers is interested in becoming our patient and working on intensive lifestyle modifications including (but not limited to) diet and exercise for weight loss.  Patient was referred by PCP.  She has a history of kidney stones more than twenty years ago.  Does have issues with insomnia.  She works at a stressful job and she does ruminate in the evening when its time for bed. She works 40+ hours a week.  She lives at home with her husband Monica Flowers.  She said her family is supportive of her and will be eating healthier with her but she anticipates sabotage from her mother. Previously tried Atkins, [employer], amino drops and no sugar or flour previously to lose weight. For all of these she has lost weight but then regained all weight loss and then some.  Eats out 5 or more times a week. Food dislikes are veggies, fish/seafood, coconut, broccoli.  She considers herself a picky eater.   Food Recall: breakfast of cheerios (1.5-2 cups) with splenda mix and 2% milk (satisfied) or may eat at [facility] a.  At work later in the am she may do a little bag of chips (not everyday) and 5 hershey kisses throughout the am (sometimes a bit hunger and sometimes habit).  Lunch is a sandwich of tomato or PB&J or BLT.  May do a few chips on the plate with the sandwich.  May do something sweet like a slice of pie if there is something sweet at her house.  Supper is at home and can be something like chicken, spaghetti  or steak. Steak meal is 3.5oz of meat, potatoes (0.75cup), salad with dressing (feels a combo of full and satisfied).   Monica Flowers's habits were reviewed today and are as follows: Her family eats meals together, she thinks her family will eat healthier with her, she struggles with family and or coworkers weight loss sabotage, she started gaining excessive weight in her 40's, her heaviest weight ever was 265 pounds, she is a picky eater and doesn't like to eat healthier foods, she has significant food cravings issues, she skips meals frequently, she is frequently drinking liquids with calories, she frequently makes poor food choices, she has problems with excessive hunger, she frequently eats larger portions than normal, and she struggles with emotional eating.  Depression Screen Monica Flowers's Food and Mood (modified PHQ-9) score was 13.  Subjective:   1. Other fatigue Monica Flowers admits to daytime somnolence and admits to waking up still tired. Patient has a history of symptoms of daytime fatigue, morning fatigue, and morning headache. Monica Flowers generally gets 7 hours of sleep per night, and states that she has nightime awakenings and generally restful sleep. Snoring is present. Apneic episodes are present. Epworth Sleepiness Score is 6.  EKG-normal sinus rhythm at 58 bpm; poor R wave progression.  2. SOBOE (shortness of breath on exertion) Monica Flowers notes increasing shortness of breath with exercising and seems to be worsening over time with weight gain. She notes getting out of breath  sooner with activity than she used to. This has not gotten worse recently. Monica Flowers denies shortness of breath at rest or orthopnea.  3. Vitamin D deficiency Patient's vitamin D value decreased on her labs done on 02/01/2023 (29.7).  She notes fatigue, and she is on OTC vitamin D currently.  4. Pure hypercholesterolemia Patient's last LDL was 108, HDL 53.4, and triglycerides 107.0.  She is not on medications.  5.  History of kidney stones Patient's last kidney stones was 20+ years ago.  6. OSA (obstructive sleep apnea) Patient has a historical diagnosis, and has CPAP.  She wears CPAP with 99% compliance, and she has a sleep doctor.  Assessment/Plan:   1. Other fatigue Monica Flowers does feel that her weight is causing her energy to be lower than it should be. Fatigue may be related to obesity, depression or many other causes. Labs will be ordered, and in the meanwhile, Monica Flowers will focus on self care including making healthy food choices, increasing physical activity and focusing on stress reduction.  - EKG 12-Lead - Vitamin B12 - T4, free - T3 - Comprehensive metabolic panel - Folate - Hemoglobin A1c - Insulin, random - TSH  2. SOBOE (shortness of breath on exertion) Monica Flowers does feel that she gets out of breath more easily that she used to when she exercises. Monica Flowers's shortness of breath appears to be obesity related and exercise induced. She has agreed to work on weight loss and gradually increase exercise to treat her exercise induced shortness of breath. Will continue to monitor closely.  - CBC with Differential/Platelet  3. Vitamin D deficiency We will check labs today, we will follow-up at patient's next appointment.  - VITAMIN D 25 Hydroxy (Vit-D Deficiency, Fractures)  4. Pure hypercholesterolemia We will check labs today, and we will follow-up at patient's next appointment.  - Lipid Panel With LDL/HDL Ratio  5. History of kidney stones No intervention or change in food plan at this time.  6. OSA (obstructive sleep apnea) Patient will continue her CPAP usage.  7. Depression screening Monica Flowers had a positive depression screening. Depression is commonly associated with obesity and often results in emotional eating behaviors. We will monitor this closely and work on CBT to help improve the non-hunger eating patterns. Referral to Psychology may be required if no improvement  is seen as she continues in our clinic.  8. BMI 40.0-44.9, adult (HCC)  9. Obesity with starting BMI of 44.5 Monica Flowers is currently in the action stage of change and her goal is to continue with weight loss efforts. I recommend Monica Flowers begin the structured treatment plan as follows:  She has agreed to the Category 3 Plan with 8 oz of protein.  Exercise goals: No exercise has been prescribed at this time.   Behavioral modification strategies: increasing lean protein intake, no skipping meals, meal planning and cooking strategies, and planning for success.  She was informed of the importance of frequent follow-up visits to maximize her success with intensive lifestyle modifications for her multiple health conditions. She was informed we would discuss her lab results at her next visit unless there is a critical issue that needs to be addressed sooner. Monica Flowers agreed to keep her next visit at the agreed upon time to discuss these results.  Objective:   Blood pressure 130/82, pulse 63, temperature (!) 97.5 F (36.4 C), height 5\' 4"  (1.626 m), weight 259 lb (117.5 kg), last menstrual period 01/08/2023, SpO2 100%. Body mass index is 44.46 kg/m.  EKG: Normal sinus rhythm,  rate 58 BPM.  Indirect Calorimeter completed today shows a VO2 of 254 and a REE of 1757.  Her calculated basal metabolic rate is 2841 thus her basal metabolic rate is worse than expected.  General: Cooperative, alert, well developed, in no acute distress. HEENT: Conjunctivae and lids unremarkable. Cardiovascular: Regular rhythm.  Lungs: Normal work of breathing. Neurologic: No focal deficits.   Lab Results  Component Value Date   CREATININE 0.98 02/01/2023   BUN 16 02/01/2023   NA 137 02/01/2023   K 4.1 02/01/2023   CL 102 02/01/2023   CO2 27 02/01/2023   Lab Results  Component Value Date   ALT 17 01/26/2022   AST 15 01/26/2022   ALKPHOS 85 01/26/2022   BILITOT 0.6 01/26/2022   Lab Results  Component  Value Date   HGBA1C 4.9 12/26/2019   No results found for: "INSULIN" Lab Results  Component Value Date   TSH 1.63 02/01/2023   Lab Results  Component Value Date   CHOL 183 02/01/2023   HDL 53.40 02/01/2023   LDLCALC 108 (H) 02/01/2023   TRIG 107.0 02/01/2023   CHOLHDL 3 02/01/2023   Lab Results  Component Value Date   WBC 7.2 02/01/2023   HGB 14.4 02/01/2023   HCT 43.3 02/01/2023   MCV 93.7 02/01/2023   PLT 317.0 02/01/2023   No results found for: "IRON", "TIBC", "FERRITIN"  Attestation Statements:   Reviewed by clinician on day of visit: allergies, medications, problem list, medical history, surgical history, family history, social history, and previous encounter notes.   I, Burt Knack, am acting as transcriptionist for [name], MD.  This is the patient's first visit at [facility]. The patient's NEW PATIENT PACKET was reviewed at length. Included in the packet: current and past health history, medications, allergies, ROS, gynecologic history (women only), surgical history, family history, social history, weight history, weight loss surgery history (for those that have had weight loss surgery), nutritional evaluation, mood and food questionnaire, PHQ9, Epworth questionnaire, sleep habits questionnaire, patient life and health improvement goals questionnaire. These will all be scanned into the patient's chart under media.   During the visit, I independently reviewed the patient's EKG, bioimpedance scale results, and indirect calorimeter results. I used this information to tailor a meal plan for the patient that will help her to lose weight and will improve her obesity-related conditions going forward. I performed a medically necessary appropriate examination and/or evaluation. I discussed the assessment and treatment plan with the patient. The patient was provided an opportunity to ask questions and all were answered. The patient agreed with the plan  and demonstrated an understanding of the instructions. Labs were ordered at this visit and will be reviewed at the next visit unless more critical results need to be addressed immediately. Clinical information was updated and documented in the EMR.    I have reviewed the above documentation for accuracy and completeness, and I agree with the above. - [name], MD

## 2023-05-06 LAB — T3: T3, Total: 132 ng/dL (ref 71–180)

## 2023-05-06 LAB — COMPREHENSIVE METABOLIC PANEL
ALT: 19 [IU]/L (ref 0–32)
AST: 22 [IU]/L (ref 0–40)
Albumin: 4.3 g/dL (ref 3.8–4.9)
Alkaline Phosphatase: 106 [IU]/L (ref 44–121)
BUN/Creatinine Ratio: 15 (ref 12–28)
BUN: 15 mg/dL (ref 8–27)
Bilirubin Total: 0.6 mg/dL (ref 0.0–1.2)
CO2: 24 mmol/L (ref 20–29)
Calcium: 9.8 mg/dL (ref 8.7–10.3)
Chloride: 101 mmol/L (ref 96–106)
Creatinine, Ser: 1.02 mg/dL — ABNORMAL HIGH (ref 0.57–1.00)
Globulin, Total: 2.6 g/dL (ref 1.5–4.5)
Glucose: 81 mg/dL (ref 70–99)
Potassium: 4.5 mmol/L (ref 3.5–5.2)
Sodium: 140 mmol/L (ref 134–144)
Total Protein: 6.9 g/dL (ref 6.0–8.5)
eGFR: 63 mL/min/{1.73_m2} (ref >59)

## 2023-05-06 LAB — LIPID PANEL WITH LDL/HDL RATIO
Cholesterol, Total: 193 mg/dL (ref 100–199)
HDL: 59 mg/dL (ref >39)
LDL Chol Calc (NIH): 110 mg/dL — ABNORMAL HIGH (ref 0–99)
LDL/HDL Ratio: 1.9 {ratio} (ref 0.0–3.2)
Triglycerides: 136 mg/dL (ref 0–149)
VLDL Cholesterol Cal: 24 mg/dL (ref 5–40)

## 2023-05-06 LAB — CBC WITH DIFFERENTIAL/PLATELET
Basophils Absolute: 0 10*3/uL (ref 0.0–0.2)
Basos: 1 %
EOS (ABSOLUTE): 0.1 10*3/uL (ref 0.0–0.4)
Eos: 2 %
Hematocrit: 45.1 % (ref 34.0–46.6)
Hemoglobin: 14.8 g/dL (ref 11.1–15.9)
Immature Grans (Abs): 0 10*3/uL (ref 0.0–0.1)
Immature Granulocytes: 0 %
Lymphocytes Absolute: 1.8 10*3/uL (ref 0.7–3.1)
Lymphs: 30 %
MCH: 31.4 pg (ref 26.6–33.0)
MCHC: 32.8 g/dL (ref 31.5–35.7)
MCV: 96 fL (ref 79–97)
Monocytes Absolute: 0.4 10*3/uL (ref 0.1–0.9)
Monocytes: 6 %
Neutrophils Absolute: 3.7 10*3/uL (ref 1.4–7.0)
Neutrophils: 61 %
Platelets: 290 10*3/uL (ref 150–450)
RBC: 4.72 x10E6/uL (ref 3.77–5.28)
RDW: 11.9 % (ref 11.7–15.4)
WBC: 6 10*3/uL (ref 3.4–10.8)

## 2023-05-06 LAB — T4, FREE: Free T4: 1.34 ng/dL (ref 0.82–1.77)

## 2023-05-06 LAB — HEMOGLOBIN A1C
Est. average glucose Bld gHb Est-mCnc: 103 mg/dL
Hgb A1c MFr Bld: 5.2 % (ref 4.8–5.6)

## 2023-05-06 LAB — TSH: TSH: 1.47 u[IU]/mL (ref 0.450–4.500)

## 2023-05-06 LAB — VITAMIN B12: Vitamin B-12: 532 pg/mL (ref 232–1245)

## 2023-05-06 LAB — FOLATE: Folate: 10.7 ng/mL (ref >3.0)

## 2023-05-06 LAB — INSULIN, RANDOM: INSULIN: 11.7 u[IU]/mL (ref 2.6–24.9)

## 2023-05-06 LAB — VITAMIN D 25 HYDROXY (VIT D DEFICIENCY, FRACTURES): Vit D, 25-Hydroxy: 73.7 ng/mL (ref 30.0–100.0)

## 2023-05-16 ENCOUNTER — Ambulatory Visit (INDEPENDENT_AMBULATORY_CARE_PROVIDER_SITE_OTHER): Payer: Self-pay | Admitting: Family Medicine

## 2023-05-18 ENCOUNTER — Ambulatory Visit (INDEPENDENT_AMBULATORY_CARE_PROVIDER_SITE_OTHER): Payer: Self-pay | Admitting: Family Medicine

## 2023-05-19 ENCOUNTER — Encounter: Payer: Self-pay | Admitting: *Deleted

## 2023-05-24 ENCOUNTER — Ambulatory Visit (INDEPENDENT_AMBULATORY_CARE_PROVIDER_SITE_OTHER): Payer: BC Managed Care – PPO | Admitting: Family Medicine

## 2023-05-24 ENCOUNTER — Encounter (INDEPENDENT_AMBULATORY_CARE_PROVIDER_SITE_OTHER): Payer: Self-pay | Admitting: Family Medicine

## 2023-05-24 VITALS — BP 126/78 | HR 67 | Temp 98.1°F | Ht 64.0 in | Wt 258.0 lb

## 2023-05-24 DIAGNOSIS — E88819 Insulin resistance, unspecified: Secondary | ICD-10-CM | POA: Diagnosis not present

## 2023-05-24 DIAGNOSIS — Z6841 Body Mass Index (BMI) 40.0 and over, adult: Secondary | ICD-10-CM

## 2023-05-24 DIAGNOSIS — E669 Obesity, unspecified: Secondary | ICD-10-CM

## 2023-05-24 DIAGNOSIS — E7849 Other hyperlipidemia: Secondary | ICD-10-CM | POA: Diagnosis not present

## 2023-05-24 DIAGNOSIS — E559 Vitamin D deficiency, unspecified: Secondary | ICD-10-CM | POA: Diagnosis not present

## 2023-05-24 NOTE — Progress Notes (Unsigned)
Chief Complaint:   OBESITY Monica Flowers is here to discuss her progress with her obesity treatment plan along with follow-up of her obesity related diagnoses. [name] is on the Category 3 Plan + 8 oz and states she is following her eating plan approximately 40% of the time. [name] states she is doing quick hits for 7-15 minutes 5 times per week.  Today's visit was #: 2 Starting weight: 259 lbs Starting date: 05/05/2023 Today's weight: 258 lbs Today's date: 05/24/2023 Total lbs lost to date: 1 Total lbs lost since last in-office visit: 1  Interim History: Patient does not care for the bread even with spray butter and sugar free jelly.  She does not like the three eggs and can't get all the eggs and [place] sausage in.  She is not a fan of greek yogurt.  She did use some of her snack calories for olive oil and is wondering about using some of her snack calories for butter. She finds breakfast to be the hardest.  She is using tablespoon of miracle cool light on her bread.    Subjective:   1. Vitamin D deficiency Patient is on OTC vitamin D, and her vitamin D level is 73.7.  She notes fatigue but this has improved.  She is currently taking 4,000 IU daily.  2. Other hyperlipidemia Patient's recent LDL was 110, HDL 59, and triglycerides 161.  She is not on medications, and she has no other comorbidities to risk stratify.  Her 10-year ASCVD risk score is of 3.1% (optimal 2.3%).   3. Insulin resistance Patient is not on medications, and she denies hunger or cravings.  Assessment/Plan:   1. Vitamin D deficiency Patient will continue OTC vitamin D with no change in dose.  2. Other hyperlipidemia Patient will continue her Category 3 plan with other options for breakfast.  We will repeat labs in 3 to 4 months.  3. Insulin resistance Patient is to continue following her Category 3 plan with focus on making equivalent nutritional substitutes as desired.  4. BMI 40.0-44.9, adult  (HCC)  5. Obesity with starting BMI of 44.5 Monica Flowers is currently in the action stage of change. As such, her goal is to continue with weight loss efforts. She has agreed to the Category 3 Plan + 8 oz of protein at dinner.   Exercise goals: No exercise has been prescribed at this time.  Behavioral modification strategies: increasing lean protein intake, meal planning and cooking strategies, keeping healthy foods in the home, and planning for success.  [name] has agreed to follow-up with our clinic in 2 weeks. She was informed of the importance of frequent follow-up visits to maximize her success with intensive lifestyle modifications for her multiple health conditions.   Objective:   Blood pressure 126/78, pulse 67, temperature 98.1 F (36.7 C), height 5\' 4"  (1.626 m), weight 258 lb (117 kg), last menstrual period 01/08/2023, SpO2 97%. Body mass index is 44.29 kg/m.  General: Cooperative, alert, well developed, in no acute distress. HEENT: Conjunctivae and lids unremarkable. Cardiovascular: Regular rhythm.  Lungs: Normal work of breathing. Neurologic: No focal deficits.   Lab Results  Component Value Date   CREATININE 1.02 (H) 05/05/2023   BUN 15 05/05/2023   NA 140 05/05/2023   K 4.5 05/05/2023   CL 101 05/05/2023   CO2 24 05/05/2023   Lab Results  Component Value Date   ALT 19 05/05/2023   AST 22 05/05/2023   ALKPHOS 106 05/05/2023   BILITOT 0.6  05/05/2023   Lab Results  Component Value Date   HGBA1C 5.2 05/05/2023   HGBA1C 4.9 12/26/2019   Lab Results  Component Value Date   INSULIN 11.7 05/05/2023   Lab Results  Component Value Date   TSH 1.470 05/05/2023   Lab Results  Component Value Date   CHOL 193 05/05/2023   HDL 59 05/05/2023   LDLCALC 110 (H) 05/05/2023   TRIG 136 05/05/2023   CHOLHDL 3 02/01/2023   Lab Results  Component Value Date   VD25OH 73.7 05/05/2023   VD25OH 29.77 (L) 02/01/2023   VD25OH 22.95 (L) 12/26/2019   Lab Results   Component Value Date   WBC 6.0 05/05/2023   HGB 14.8 05/05/2023   HCT 45.1 05/05/2023   MCV 96 05/05/2023   PLT 290 05/05/2023   No results found for: "IRON", "TIBC", "FERRITIN"  Attestation Statements:   Reviewed by clinician on day of visit: allergies, medications, problem list, medical history, surgical history, family history, social history, and previous encounter notes.   I, Burt Knack, am acting as transcriptionist for [name], MD.  I have reviewed the above documentation for accuracy and completeness, and I agree with the above. - [name], MD

## 2023-06-09 ENCOUNTER — Encounter (INDEPENDENT_AMBULATORY_CARE_PROVIDER_SITE_OTHER): Payer: Self-pay | Admitting: Family Medicine

## 2023-06-09 ENCOUNTER — Ambulatory Visit (INDEPENDENT_AMBULATORY_CARE_PROVIDER_SITE_OTHER): Payer: BC Managed Care – PPO | Admitting: Family Medicine

## 2023-06-09 VITALS — BP 137/78 | HR 78 | Temp 98.3°F | Ht 64.0 in | Wt 251.0 lb

## 2023-06-09 DIAGNOSIS — E7849 Other hyperlipidemia: Secondary | ICD-10-CM

## 2023-06-09 DIAGNOSIS — E669 Obesity, unspecified: Secondary | ICD-10-CM | POA: Diagnosis not present

## 2023-06-09 DIAGNOSIS — K5909 Other constipation: Secondary | ICD-10-CM

## 2023-06-09 DIAGNOSIS — K5900 Constipation, unspecified: Secondary | ICD-10-CM | POA: Diagnosis not present

## 2023-06-09 DIAGNOSIS — Z6841 Body Mass Index (BMI) 40.0 and over, adult: Secondary | ICD-10-CM | POA: Diagnosis not present

## 2023-06-09 MED ORDER — POLYETHYLENE GLYCOL 3350 17 G PO PACK
17.0000 g | PACK | Freq: Every day | ORAL | Status: AC
Start: 2023-06-09 — End: ?

## 2023-06-09 NOTE — Assessment & Plan Note (Signed)
Patient having increased symptoms recently.  She has tried metamucil with minimal improvement in symptoms.  She has started taking castor oil daily.  Patient was encouraged to take Miralax daily instead for at least a month.

## 2023-06-09 NOTE — Progress Notes (Signed)
[facility] MEDICAL WEIGHT [facility] HEALTHY WEIGHT & WELLNESS AT [place] [address] [place] [address] Dept: [phone] Dept Fax: [phone]  AT A GLANCE:  Vitals Temp: 98.3 F (36.8 C) BP: 137/78 Pulse Rate: 78 SpO2: 96 %   Anthropometric Measurements Height: 5\' 4"  (1.626 m) Weight: 251 lb (113.9 kg) BMI (Calculated): 43.06 Weight at Last Visit: 258 lb Weight Lost Since Last Visit: 7 Weight Gained Since Last Visit: 0 Starting Weight: 259 lbs Total Weight Loss (lbs): 8 lb (3.629 kg)   Body Composition  Body Fat %: 43.1 % Fat Mass (lbs): 108.4 lbs Muscle Mass (lbs): 135.8 lbs Total Body Water (lbs): 94.6 lbs Visceral Fat Rating : 15   Other Clinical Data Today's Visit #: 3 Starting Date: 05/05/23     SUBJECTIVE: Patient dealing with some increase in her constipation.    Monica Flowers is here to discuss her progress with her obesity treatment plan. She is on the Category 3 Plan and states she is following her eating plan approximately 90 % of the time. She states she is not exercising.  She has resorted to eating Metamucil tablets and when that didn't work she added 3 tsp of Castor Oil to her intake. She is getting all the food on plan in for most of the time.  She has getting more green beans in for supper.  Not hungry and she is trying to throw some intermittent fasting to space out intake. Doesn't feel like she needs to make any changes to the meal plan in the upcoming few weeks.  Her husband has an ablation mid November.  She leaves for [place] the Saturday after Thanksgiving.   OBJECTIVE: Visit Diagnoses: Problem List Items Addressed This Visit       Other   Other hyperlipidemia - Primary    Last LDL 110- minimally elevated. HDL 59,  Trigly 136. Not on medications and non indicated at this time.  Repeat labs in February.       Constipation    Patient having increased symptoms recently.  She has tried metamucil with minimal  improvement in symptoms.  She has started taking castor oil daily.  Patient was encouraged to take Miralax daily instead for at least a month.      Relevant Medications   polyethylene glycol (MIRALAX) 17 g packet       ASSESSMENT AND PLAN: Problem List Items Addressed This Visit       Other   Other hyperlipidemia - Primary    Last LDL 110- minimally elevated. HDL 59,  Trigly 136. Not on medications and non indicated at this time.  Repeat labs in February.       Constipation    Patient having increased symptoms recently.  She has tried metamucil with minimal improvement in symptoms.  She has started taking castor oil daily.  Patient was encouraged to take Miralax daily instead for at least a month.      Relevant Medications   polyethylene glycol (MIRALAX) 17 g packet    Diet: Monica Flowers is currently in the action stage of change. As such, her goal is to continue with weight loss efforts. She has agreed to Category 3 Plan.  Exercise: Monica Flowers has been instructed that some exercise is better than none for weight loss and overall health benefits.   Behavior Modification:  We discussed the following Behavioral Modification Strategies today: increasing lean protein intake, increase H2O intake, no skipping meals, and meal planning and cooking strategies.  No follow-ups on file.Marland Kitchen She was informed of the importance of frequent follow up visits to maximize her success with intensive lifestyle modifications for her multiple health conditions.  Attestation Statements:   Reviewed by clinician on day of visit: allergies, medications, problem list, medical history, surgical history, family history, social history, and previous encounter notes.   Time spent on visit including pre-visit chart review and post-visit care and charting was 25 minutes.

## 2023-06-09 NOTE — Assessment & Plan Note (Signed)
Last LDL 110- minimally elevated. HDL 59,  Trigly 136. Not on medications and non indicated at this time.  Repeat labs in February.

## 2023-07-18 ENCOUNTER — Ambulatory Visit (INDEPENDENT_AMBULATORY_CARE_PROVIDER_SITE_OTHER): Payer: BC Managed Care – PPO | Admitting: Family Medicine

## 2023-07-18 VITALS — BP 120/78 | HR 62 | Temp 98.4°F | Ht 64.0 in | Wt 251.0 lb

## 2023-07-18 DIAGNOSIS — R030 Elevated blood-pressure reading, without diagnosis of hypertension: Secondary | ICD-10-CM

## 2023-07-18 DIAGNOSIS — E7849 Other hyperlipidemia: Secondary | ICD-10-CM

## 2023-07-18 DIAGNOSIS — Z6841 Body Mass Index (BMI) 40.0 and over, adult: Secondary | ICD-10-CM

## 2023-07-18 DIAGNOSIS — E669 Obesity, unspecified: Secondary | ICD-10-CM

## 2023-07-18 NOTE — Assessment & Plan Note (Signed)
Increased stress on driving to appointment and coming back to start work today.  No chest pain, chest pressure, headache.  Follow up on BP at next appointment.

## 2023-07-18 NOTE — Progress Notes (Signed)
   SUBJECTIVE:  Chief Complaint: Obesity  Interim History: Patient had a busy week prior to Thanksgiving and then left the Friday after.  She felt vacation was too short.  She felt she was compliant leading up to the week before Thanksgiving then started to be less compliant when she she was working more. For this month she doesn't have much in terms of events or activities.  For New Years she will stay home.   Monica Flowers is here to discuss her progress with her obesity treatment plan. She is on the Category 3 Plan and states she is following her eating plan approximately 40 % of the time. She states she is not exercising.   OBJECTIVE: Visit Diagnoses: Problem List Items Addressed This Visit       Other   Other hyperlipidemia - Primary    Patient not on statin.  Last LDL 110.  Will repeat labs early February to reassess.      Elevated blood pressure reading    Increased stress on driving to appointment and coming back to start work today.  No chest pain, chest pressure, headache.  Follow up on BP at next appointment.      Other Visit Diagnoses     Obesity with starting BMI of 44.5       BMI 40.0-44.9, adult (HCC)           Vitals Temp: 98.4 F (36.9 C) BP: 120/78 Pulse Rate: 62 SpO2: 98 %   Anthropometric Measurements Height: 5\' 4"  (1.626 m) Weight: 251 lb (113.9 kg) BMI (Calculated): 43.06 Weight at Last Visit: 251 lb Weight Lost Since Last Visit: 0 Weight Gained Since Last Visit: 0 Starting Weight: 259 lb Total Weight Loss (lbs): 8 lb (3.629 kg)   Body Composition  Body Fat %: 50.9 % Fat Mass (lbs): 128 lbs Muscle Mass (lbs): 117.2 lbs Total Body Water (lbs): 96.2 lbs Visceral Fat Rating : 17   Other Clinical Data Today's Visit #: 4 Starting Date: 05/05/23     ASSESSMENT AND PLAN:  Diet: Monica Flowers is currently in the action stage of change. As such, her goal is to continue with weight loss efforts. She has agreed to Category 3 Plan.  Biggest  obstacle will be control of carbohydrate intake over the holidays.  Exercise: Monica Flowers has been instructed that some exercise is better than none for weight loss and overall health benefits.  She is beginning to finalize the plan she has for activity in the next few weeks.   Behavior Modification:  We discussed the following Behavioral Modification Strategies today: increasing lean protein intake, increasing vegetables, meal planning and cooking strategies, better snacking choices, and holiday eating strategies.   No follow-ups on file.Marland Kitchen She was informed of the importance of frequent follow up visits to maximize her success with intensive lifestyle modifications for her multiple health conditions.  Attestation Statements:   Reviewed by clinician on day of visit: allergies, medications, problem list, medical history, surgical history, family history, social history, and previous encounter notes.   [name] MD

## 2023-07-18 NOTE — Assessment & Plan Note (Signed)
Patient not on statin.  Last LDL 110.  Will repeat labs early February to reassess.

## 2023-07-28 ENCOUNTER — Ambulatory Visit: Payer: BC Managed Care – PPO | Admitting: Dermatology

## 2023-07-28 ENCOUNTER — Encounter: Payer: Self-pay | Admitting: Dermatology

## 2023-07-28 DIAGNOSIS — Z7189 Other specified counseling: Secondary | ICD-10-CM

## 2023-07-28 DIAGNOSIS — D225 Melanocytic nevi of trunk: Secondary | ICD-10-CM

## 2023-07-28 DIAGNOSIS — W908XXA Exposure to other nonionizing radiation, initial encounter: Secondary | ICD-10-CM | POA: Diagnosis not present

## 2023-07-28 DIAGNOSIS — D229 Melanocytic nevi, unspecified: Secondary | ICD-10-CM

## 2023-07-28 DIAGNOSIS — Z1283 Encounter for screening for malignant neoplasm of skin: Secondary | ICD-10-CM

## 2023-07-28 DIAGNOSIS — L578 Other skin changes due to chronic exposure to nonionizing radiation: Secondary | ICD-10-CM

## 2023-07-28 DIAGNOSIS — L814 Other melanin hyperpigmentation: Secondary | ICD-10-CM | POA: Diagnosis not present

## 2023-07-28 DIAGNOSIS — L821 Other seborrheic keratosis: Secondary | ICD-10-CM

## 2023-07-28 NOTE — Patient Instructions (Signed)

## 2023-07-28 NOTE — Progress Notes (Signed)
Follow-Up Visit   Subjective  Monica Flowers is a 62 y.o. female who presents for the following: Skin Cancer Screening and Full Body Skin Exam. No personal Hx of skin cancer or dysplastic nevi.   Check nose. Has been lasered a couple of times. Has recurred and seems larger than before  The patient presents for Total-Body Skin Exam (TBSE) for skin cancer screening and mole check. The patient has spots, moles and lesions to be evaluated, some may be new or changing and the patient may have concern these could be cancer.  The following portions of the chart were reviewed this encounter and updated as appropriate: medications, allergies, medical history  Review of Systems:  No other skin or systemic complaints except as noted in HPI or Assessment and Plan.  Objective  Well appearing patient in no apparent distress; mood and affect are within normal limits.  A full examination was performed including scalp, head, eyes, ears, nose, lips, neck, chest, axillae, abdomen, back, buttocks, bilateral upper extremities, bilateral lower extremities, hands, feet, fingers, toes, fingernails, and toenails. All findings within normal limits unless otherwise noted below.   Relevant physical exam findings are noted in the Assessment and Plan.                  Assessment & Plan   SKIN CANCER SCREENING PERFORMED TODAY.  ACTINIC DAMAGE - Chronic condition, secondary to cumulative UV/sun exposure - diffuse scaly erythematous macules with underlying dyspigmentation - Recommend daily broad spectrum sunscreen SPF 30+ to sun-exposed areas, reapply every 2 hours as needed.  - Staying in the shade or wearing long sleeves, sun glasses (UVA+UVB protection) and wide brim hats (4-inch brim around the entire circumference of the hat) are also recommended for sun protection.  - Call for new or changing lesions.  LENTIGINES, SEBORRHEIC KERATOSES, HEMANGIOMAS - Benign normal skin lesions -  Benign-appearing - Call for any changes  MELANOCYTIC NEVI - Tan-brown and/or pink-flesh-colored symmetric macules and papules - Benign appearing on exam today - Observation - Call clinic for new or changing moles - Recommend daily use of broad spectrum spf 30+ sunscreen to sun-exposed areas.   LENTIGO - large Of dorsum nose supratip See photos Pt gives history of this being removed by dermatologist in a [facility] setting who suspected it was melanoma.  The area was apparently excised leaving the large scar that remains today.  The area repigmented over the scar. Patient states pathology was benign and she recalls it showing a "freckle"  Subsequent biopsy by me in 2021 revealed benign lentigo.  I have treated with light LN2 destruction and BBL laser with some improvement. The patient is advised that she may require several further treatments to improve this.  He may still get repigmentation nevertheless. Exam: tan-light brown patch at tip of nose.  Due to sun exposure Treatment Plan: Benign-appearing, observe. Recommend daily broad spectrum sunscreen SPF 30+ to sun-exposed areas, reapply every 2 hours as needed.  Call for any changes  Reviewed biopsy from 06/09/2020.  Counseling for BBL / IPL / Laser and Coordination of Care Discussed the treatment option of Broad Band Light (BBL) /Intense Pulsed Light (IPL)/ Laser for skin discoloration, including brown spots and redness.  Typically we recommend at least 1-3 treatment sessions about 5-8 weeks apart for best results.  Cannot have tanned skin when BBL performed, and regular use of sunscreen/photoprotection is advised after the procedure to help maintain results. The patient's condition may also require "maintenance treatments" in the future.  The fee for BBL / laser treatments is $350 per treatment session for the whole face.  A fee can be quoted for other parts of the body.  Insurance typically does not pay for BBL/laser treatments and  therefore the fee is an out-of-pocket cost. Recommend prophylactic valtrex treatment. Once scheduled for procedure, will send Rx in prior to patient's appointment.    MELANOCYTIC NEVI Exam: ~2 mm med-dark brown macules x2 at mid back B/L paraspinal.  Treatment Plan: Benign appearing on exam today. Recommend observation. Call clinic for new or changing moles. Recommend daily use of broad spectrum spf 30+ sunscreen to sun-exposed areas.   Return in about 1 year (around 07/27/2024) for TBSE.  I, Lawson Radar, CMA, am acting as scribe for Armida Sans, MD.   Documentation: I have reviewed the above documentation for accuracy and completeness, and I agree with the above.  Armida Sans, MD

## 2023-08-08 ENCOUNTER — Encounter: Payer: Self-pay | Admitting: Dermatology

## 2023-08-22 ENCOUNTER — Encounter: Payer: Self-pay | Admitting: *Deleted

## 2023-08-30 ENCOUNTER — Encounter: Payer: Self-pay | Admitting: *Deleted

## 2023-08-31 ENCOUNTER — Encounter: Payer: Self-pay | Admitting: *Deleted

## 2023-09-06 ENCOUNTER — Ambulatory Visit (INDEPENDENT_AMBULATORY_CARE_PROVIDER_SITE_OTHER): Payer: Self-pay | Admitting: Family Medicine

## 2023-09-28 ENCOUNTER — Encounter: Payer: Self-pay | Admitting: Genetic Counselor

## 2023-10-05 ENCOUNTER — Telehealth (INDEPENDENT_AMBULATORY_CARE_PROVIDER_SITE_OTHER): Payer: Self-pay | Admitting: Family Medicine

## 2023-10-05 NOTE — Telephone Encounter (Signed)
 Patient having insurance issues and may need to pay an additional program fee once they resolve.

## 2023-11-09 ENCOUNTER — Other Ambulatory Visit: Payer: Self-pay | Admitting: Family

## 2023-11-09 DIAGNOSIS — F5104 Psychophysiologic insomnia: Secondary | ICD-10-CM

## 2023-11-10 NOTE — Telephone Encounter (Signed)
 Please have pt schedule annual physical after 6/25. Will need appt for future refills after that time

## 2023-11-10 NOTE — Telephone Encounter (Signed)
 Spoke to pt, scheduled cpe for 02/06/24

## 2024-02-06 ENCOUNTER — Ambulatory Visit: Payer: Self-pay | Admitting: Family

## 2024-02-06 ENCOUNTER — Other Ambulatory Visit (HOSPITAL_COMMUNITY)
Admission: RE | Admit: 2024-02-06 | Discharge: 2024-02-06 | Disposition: A | Discharge status: Home / Self Care | Source: Ambulatory Visit | Attending: Family | Admitting: Family

## 2024-02-06 ENCOUNTER — Encounter: Payer: Self-pay | Admitting: Family

## 2024-02-06 VITALS — BP 120/80 | HR 65 | Temp 98.6°F | Ht 64.0 in | Wt 248.0 lb

## 2024-02-06 DIAGNOSIS — E7849 Other hyperlipidemia: Secondary | ICD-10-CM

## 2024-02-06 DIAGNOSIS — E673 Hypervitaminosis D: Secondary | ICD-10-CM

## 2024-02-06 DIAGNOSIS — Z124 Encounter for screening for malignant neoplasm of cervix: Secondary | ICD-10-CM

## 2024-02-06 DIAGNOSIS — Z23 Encounter for immunization: Secondary | ICD-10-CM | POA: Diagnosis not present

## 2024-02-06 DIAGNOSIS — Z0000 Encounter for general adult medical examination without abnormal findings: Secondary | ICD-10-CM

## 2024-02-06 DIAGNOSIS — E559 Vitamin D deficiency, unspecified: Secondary | ICD-10-CM | POA: Diagnosis not present

## 2024-02-06 DIAGNOSIS — Z1231 Encounter for screening mammogram for malignant neoplasm of breast: Secondary | ICD-10-CM

## 2024-02-06 DIAGNOSIS — Z1211 Encounter for screening for malignant neoplasm of colon: Secondary | ICD-10-CM

## 2024-02-06 LAB — LIPID PANEL
Cholesterol: 165 mg/dL (ref 0–200)
HDL: 55.4 mg/dL (ref >39.00)
LDL Cholesterol: 94 mg/dL (ref 0–99)
NonHDL: 110.05
Total CHOL/HDL Ratio: 3
Triglycerides: 82 mg/dL (ref 0.0–149.0)
VLDL: 16.4 mg/dL (ref 0.0–40.0)

## 2024-02-06 LAB — CBC
HCT: 41.5 % (ref 36.0–46.0)
Hemoglobin: 14.1 g/dL (ref 12.0–15.0)
MCHC: 34 g/dL (ref 30.0–36.0)
MCV: 90.8 fl (ref 78.0–100.0)
Platelets: 257 10*3/uL (ref 150.0–400.0)
RBC: 4.57 Mil/uL (ref 3.87–5.11)
RDW: 12.8 % (ref 11.5–15.5)
WBC: 5.8 10*3/uL (ref 4.0–10.5)

## 2024-02-06 LAB — BASIC METABOLIC PANEL WITH GFR
BUN: 15 mg/dL (ref 6–23)
CO2: 28 meq/L (ref 19–32)
Calcium: 9.6 mg/dL (ref 8.4–10.5)
Chloride: 103 meq/L (ref 96–112)
Creatinine, Ser: 1.06 mg/dL (ref 0.40–1.20)
GFR: 56.92 mL/min — ABNORMAL LOW (ref >60.00)
Glucose, Bld: 91 mg/dL (ref 70–99)
Potassium: 4.1 meq/L (ref 3.5–5.1)
Sodium: 139 meq/L (ref 135–145)

## 2024-02-06 LAB — VITAMIN D 25 HYDROXY (VIT D DEFICIENCY, FRACTURES): VITD: 67.61 ng/mL (ref 30.00–100.00)

## 2024-02-06 NOTE — Assessment & Plan Note (Signed)

## 2024-02-06 NOTE — Assessment & Plan Note (Signed)
 Repeat today

## 2024-02-06 NOTE — Assessment & Plan Note (Signed)
 Ordered lipid panel, pending results. Work on low cholesterol diet and exercise as tolerated

## 2024-02-06 NOTE — Addendum Note (Signed)
 Addended by: SEBASTIAN DANNA GRADE on: 02/06/2024 09:16 AM   Modules accepted: Orders

## 2024-02-06 NOTE — Progress Notes (Signed)
 Subjective:  Patient ID: Monica Flowers, female    DOB: [birth year]  Age: 63 y.o. MRN: [id number]  Patient Care Team: Corwin Antu, FNP as PCP - General (Family Medicine) Stephani, Lynwood Charleston, MD as Consulting Physician (Pulmonary Disease) Portia Fireman, OD (Optometry)   CC:  Chief Complaint  Patient presents with   Annual Exam    HPI Monica Flowers is a 63 y.o. female who presents today for an annual physical exam. She reports consuming a general diet. Joined tops study for arthritis preventation study and they are wanting women over 50 and is going to the gym twice a week with a personal trainer and followed by a dietitian once weekly.  She generally feels well. She reports sleeping well. She does not have additional problems to discuss today.   Vision:Within last year Dental:Receives regular dental care   Mammogram: 02/23/23 Last pap: overdue Colonoscopy: has never had and has not scheduled.  TDAP: will get today Prevnar 20: will get today   Pt is without acute concerns.   Wt Readings from Last 3 Encounters:  02/06/24 248 lb (112.5 kg)  07/18/23 251 lb (113.9 kg)  06/09/23 251 lb (113.9 kg)     Advanced Directives Patient does not have advanced directives   DEPRESSION SCREENING    02/01/2023    9:18 AM 01/26/2022    9:01 AM 07/09/2020    8:01 AM 12/26/2019   10:46 AM  PHQ 2/9 Scores  PHQ - 2 Score 0 0 0 0  PHQ- 9 Score 4        ROS: Negative unless specifically indicated above in HPI.    Current Outpatient Medications:    cyanocobalamin  2000 MCG tablet, Take 2,000 mcg by mouth daily., Disp: , Rfl:    diphenhydramine-acetaminophen  (TYLENOL  PM) 25-500 MG TABS tablet, Take 1 tablet by mouth at bedtime as needed (uses in alternation with Trazodone ). , Disp: , Rfl:    docusate sodium (COLACE) 100 MG capsule, Take by mouth. , Disp: , Rfl:    Fexofenadine HCl (ALLERGY 24-HR PO), Take 2 tablets by mouth at bedtime. Allertec ([employer]) , Disp: , Rfl:     Fluticasone Propionate (FLONASE NA), Place into the nose., Disp: , Rfl:    Misc Natural Products (IMMUNE BOOST PO), Take by mouth. Nature's Bounty Immune, Disp: , Rfl:    mometasone  (ELOCON ) 0.1 % cream, Apply to affected skin 5 days a week prn, Disp: 45 g, Rfl: 11   polyethylene glycol (MIRALAX ) 17 g packet, Take 17 g by mouth daily., Disp: , Rfl:    Psyllium (METAMUCIL) 0.36 g CAPS, Take by mouth., Disp: , Rfl:    traZODone  (DESYREL ) 50 MG tablet, TAKE ONE TO TWO TABLETS (50 - 100 MG TOTAL) BY MOUTH AT BEDTIME, Disp: 180 tablet, Rfl: 0    Objective:    BP 120/80   Pulse 65   Temp 98.6 F (37 C) (Oral)   Ht 5' 4 (1.626 m)   Wt 248 lb (112.5 kg)   LMP 02/06/2023 (Within Weeks)   SpO2 97%   BMI 42.57 kg/m   BP Readings from Last 3 Encounters:  02/06/24 120/80  07/18/23 120/78  06/09/23 137/78      Physical Exam Constitutional:      General: She is not in acute distress.    Appearance: Normal appearance. She is normal weight. She is not ill-appearing.  HENT:     Head: Normocephalic.     Right Ear: Tympanic membrane normal.  Left Ear: Tympanic membrane normal.     Nose: Nose normal.     Mouth/Throat:     Mouth: Mucous membranes are moist.   Eyes:     Extraocular Movements: Extraocular movements intact.     Pupils: Pupils are equal, round, and reactive to light.    Cardiovascular:     Rate and Rhythm: Normal rate and regular rhythm.  Pulmonary:     Effort: Pulmonary effort is normal.     Breath sounds: Normal breath sounds.  Abdominal:     General: Abdomen is flat. Bowel sounds are normal.     Palpations: Abdomen is soft.     Tenderness: There is no guarding or rebound.  Genitourinary:    General: Normal vulva.     Vagina: Normal.     Cervix: Normal.   Musculoskeletal:        General: Normal range of motion.     Cervical back: Normal range of motion.   Skin:    General: Skin is warm.     Capillary Refill: Capillary refill takes less than 2 seconds.    Neurological:     General: No focal deficit present.     Mental Status: She is alert.   Psychiatric:        Mood and Affect: Mood normal.        Behavior: Behavior normal.        Thought Content: Thought content normal.        Judgment: Judgment normal.          Assessment & Plan:  Screening mammogram for breast cancer -     3D Screening Mammogram, Left and Right; Future  Screening for colon cancer -     Ambulatory referral to Gastroenterology  High vitamin D  level -     VITAMIN D  25 Hydroxy (Vit-D Deficiency, Fractures); Future  Encounter for general adult medical examination without abnormal findings Assessment & Plan: Patient Counseling(The following topics were reviewed):  Preventative care handout given to pt  Health maintenance and immunizations reviewed. Please refer to Health maintenance section. Pt advised on safe sex, wearing seatbelts in car, and proper nutrition labwork ordered today for annual Dental health: Discussed importance of regular tooth brushing, flossing, and dental visits.   Orders: -     VITAMIN D  25 Hydroxy (Vit-D Deficiency, Fractures); Future -     Lipid panel -     Basic metabolic panel with GFR -     CBC  Other hyperlipidemia Assessment & Plan: Ordered lipid panel, pending results. Work on low cholesterol diet and exercise as tolerated   Orders: -     Lipid panel  Vitamin D  deficiency Assessment & Plan: Repeat today    Screening for cervical cancer -     Cytology - PAP      Follow-up: Return in about 1 year (around 02/05/2025) for f/u CPE.   Ginger Patrick, FNP

## 2024-02-07 ENCOUNTER — Ambulatory Visit: Payer: Self-pay | Admitting: Family

## 2024-02-08 LAB — CYTOLOGY - PAP
Adequacy: ABSENT
Comment: NEGATIVE
Diagnosis: NEGATIVE
High risk HPV: NEGATIVE

## 2024-02-28 ENCOUNTER — Other Ambulatory Visit: Payer: Self-pay | Admitting: Family

## 2024-02-28 DIAGNOSIS — F5104 Psychophysiologic insomnia: Secondary | ICD-10-CM

## 2024-05-17 ENCOUNTER — Ambulatory Visit: Payer: BC Managed Care – PPO | Admitting: Dermatology

## 2024-05-17 DIAGNOSIS — W908XXA Exposure to other nonionizing radiation, initial encounter: Secondary | ICD-10-CM

## 2024-05-17 DIAGNOSIS — D039 Melanoma in situ, unspecified: Secondary | ICD-10-CM

## 2024-05-17 DIAGNOSIS — L578 Other skin changes due to chronic exposure to nonionizing radiation: Secondary | ICD-10-CM | POA: Diagnosis not present

## 2024-05-17 DIAGNOSIS — D0339 Melanoma in situ of other parts of face: Secondary | ICD-10-CM | POA: Diagnosis not present

## 2024-05-17 DIAGNOSIS — D492 Neoplasm of unspecified behavior of bone, soft tissue, and skin: Secondary | ICD-10-CM

## 2024-05-17 DIAGNOSIS — L814 Other melanin hyperpigmentation: Secondary | ICD-10-CM

## 2024-05-17 HISTORY — DX: Melanoma in situ, unspecified: D03.9

## 2024-05-17 NOTE — Progress Notes (Unsigned)
   Follow-Up Visit   Subjective  Monica Flowers is a 63 y.o. female who presents for the following: Patient here for laser of biopsy proven Lentigo on her nose, patient report brown area on her nose is getting worse.  1st biopsy was done at [facility] 2018 results showed Lentigo  2nd biopsy was done here 2021- results showed Lentigo  The following portions of the chart were reviewed this encounter and updated as appropriate: medications, allergies, medical history  Review of Systems:  No other skin or systemic complaints except as noted in HPI or Assessment and Plan.  Objective  Well appearing patient in no apparent distress; mood and affect are within normal limits.    A focused examination was performed of the following areas: Face, nose   Relevant exam findings are noted in the Assessment and Plan.  right dorsum nose supratip Irregular brown macule           Assessment & Plan   NEOPLASM OF SKIN right dorsum nose supratip Skin / nail biopsy Type of biopsy: tangential   Informed consent: discussed and consent obtained   Patient was prepped and draped in usual sterile fashion: area prepped with alochol. Anesthesia: the lesion was anesthetized in a standard fashion   Anesthetic:  1% lidocaine w/ epinephrine 1-100,000 buffered w/ 8.4% NaHCO3 Instrument used: flexible razor blade   Hemostasis achieved with: pressure, aluminum chloride and electrodesiccation   Outcome: patient tolerated procedure well   Post-procedure details: wound care instructions given   Post-procedure details comment:  Ointment and small bandage  Specimen 1 - Surgical pathology Differential Diagnosis: R/O Lentigo vs Melanoma   Check Margins: No If biopsy results show Lentigo, plan on BBL with  Skin medicinals bleaching cream or Tranexamic acid   Return for scheduled appt for BBL- nose Oct 30 .  IFay Kirks, CMA, am acting as scribe for Alm Rhyme, MD .   Documentation: I have reviewed  the above documentation for accuracy and completeness, and I agree with the above.  Alm Rhyme, MD

## 2024-05-17 NOTE — Patient Instructions (Addendum)

## 2024-05-21 ENCOUNTER — Telehealth: Payer: Self-pay

## 2024-05-21 ENCOUNTER — Encounter: Payer: Self-pay | Admitting: Dermatology

## 2024-05-21 DIAGNOSIS — D0339 Melanoma in situ of other parts of face: Secondary | ICD-10-CM

## 2024-05-21 LAB — SURGICAL PATHOLOGY

## 2024-05-21 NOTE — Telephone Encounter (Signed)
 [facility] pathology calling with verbal results- right dorsum nose- Melanoma ins situ

## 2024-05-22 ENCOUNTER — Ambulatory Visit: Payer: Self-pay | Admitting: Dermatology

## 2024-05-23 ENCOUNTER — Encounter: Payer: Self-pay | Admitting: Dermatology

## 2024-05-23 NOTE — Addendum Note (Signed)
 Addended by: TANDA SETTER A on: 05/23/2024 12:41 PM   Modules accepted: Orders

## 2024-05-23 NOTE — Telephone Encounter (Signed)
 Referral has been sent to Dr. Bluford for Mohs. Also patient prefers to be contacted at work number [phone]. Informed their office of information.

## 2024-05-23 NOTE — Telephone Encounter (Addendum)
 Called patient, patient states she did try calling back, she will be available today if doctor would like to call her and discuss over phone.   Patient set up for 3 month follow up appointment.   Will send referral to Dr. Dorn Ahle office at [facility] for treatment.   ----- Message from Alm Rhyme sent at 05/22/2024  7:03 PM EDT ----- FINAL DIAGNOSIS        1. Skin, right dorsum nose supratip :       MELANOMA IN SITU, LENTIGO MALIGNA TYPE, PERIPHERAL AND DEEP MARGINS INVOLVED,       SEE DESCRIPTION   Cancer = Melanoma in situ; Lentigo Maligna type Superficial and early Needs surgery Recommend MOHS with Dr Dorn Ahle at [facility] Will need 3 mos follow up appt.  This site had been biopsied twice in past both times showing BENIGN LENTIGO: Once at [facility] in 2018 Once at [facility] in 2021.  I called pt twice in past 2 days but went to Voicemail. I advised pt to contact office or send MyChart message to let us  know how to contact her to discuss above. Or She may make appt to discuss. ----- Message ----- From: Interface, Lab In Three [facility] One Sent: 05/21/2024   3:37 PM EDT To: Alm JAYSON Rhyme, MD

## 2024-05-29 ENCOUNTER — Telehealth: Payer: Self-pay

## 2024-05-29 NOTE — Telephone Encounter (Signed)
 Patient called to inquire about Mohs referral to Dr. Bluford at [facility]. Informed patient had spoke with them and they are still working on information. Will call in next several days and if not to please call us  back.

## 2024-06-07 ENCOUNTER — Ambulatory Visit: Admitting: Dermatology

## 2024-06-12 ENCOUNTER — Other Ambulatory Visit: Payer: Self-pay | Admitting: Family

## 2024-06-12 DIAGNOSIS — F5104 Psychophysiologic insomnia: Secondary | ICD-10-CM

## 2024-07-02 ENCOUNTER — Telehealth: Payer: Self-pay

## 2024-07-02 NOTE — Telephone Encounter (Signed)
 Patient has been scheduled for Mohs surgery with Dr. Bluford December 25 at 8:15 am

## 2024-07-23 DIAGNOSIS — D0339 Melanoma in situ of other parts of face: Secondary | ICD-10-CM | POA: Diagnosis not present

## 2024-07-25 ENCOUNTER — Ambulatory Visit: Payer: BC Managed Care – PPO | Admitting: Dermatology

## 2024-08-06 ENCOUNTER — Telehealth: Payer: Self-pay

## 2024-08-06 NOTE — Telephone Encounter (Signed)
 Specimen tracking and history updated from [facility] progress notes with Dr. Bluford on 07/23/24 on Melanoma on mid tip of nose. aw

## 2024-08-28 ENCOUNTER — Ambulatory Visit: Admitting: Dermatology

## 2024-09-17 ENCOUNTER — Ambulatory Visit: Admitting: Dermatology

## 2025-02-07 ENCOUNTER — Encounter: Admitting: Family
